# Patient Record
Sex: Male | Born: 1950 | Race: White | Hispanic: No | Marital: Married | Smoking: Former smoker
Health system: Southern US, Community
[De-identification: ages and names within clinical notes are randomized; demographics above are authoritative.]

## PROBLEM LIST (undated history)

## (undated) DIAGNOSIS — Z87442 Personal history of urinary calculi: Secondary | ICD-10-CM

## (undated) DIAGNOSIS — I10 Essential (primary) hypertension: Secondary | ICD-10-CM

## (undated) DIAGNOSIS — K219 Gastro-esophageal reflux disease without esophagitis: Secondary | ICD-10-CM

## (undated) DIAGNOSIS — C801 Malignant (primary) neoplasm, unspecified: Secondary | ICD-10-CM

## (undated) DIAGNOSIS — R29898 Other symptoms and signs involving the musculoskeletal system: Secondary | ICD-10-CM

## (undated) HISTORY — PX: NECK SURGERY: SHX720

## (undated) HISTORY — PX: COLONOSCOPY: SHX174

## (undated) HISTORY — PX: POLYPECTOMY: SHX149

## (undated) HISTORY — PX: TONSILLECTOMY: SUR1361

## (undated) HISTORY — PX: STOMACH SURGERY: SHX791

## (undated) HISTORY — PX: CATARACT EXTRACTION W/ INTRAOCULAR LENS  IMPLANT, BILATERAL: SHX1307

## (undated) HISTORY — PX: BACK SURGERY: SHX140

## (undated) HISTORY — PX: CHOLECYSTECTOMY: SHX55

---

## 1982-12-11 HISTORY — PX: STOMACH SURGERY: SHX791

## 2000-09-04 ENCOUNTER — Emergency Department (HOSPITAL_COMMUNITY): Admission: EM | Admit: 2000-09-04 | Discharge: 2000-09-04 | Payer: Self-pay | Admitting: Emergency Medicine

## 2000-09-05 ENCOUNTER — Encounter: Payer: Self-pay | Admitting: Family Medicine

## 2000-09-05 ENCOUNTER — Encounter: Admission: RE | Admit: 2000-09-05 | Discharge: 2000-09-05 | Payer: Self-pay | Admitting: Family Medicine

## 2000-09-05 ENCOUNTER — Inpatient Hospital Stay (HOSPITAL_COMMUNITY): Admission: EM | Admit: 2000-09-05 | Discharge: 2000-09-09 | Payer: Self-pay | Admitting: Emergency Medicine

## 2004-01-27 ENCOUNTER — Observation Stay (HOSPITAL_COMMUNITY): Admission: RE | Admit: 2004-01-27 | Discharge: 2004-01-28 | Payer: Self-pay | Admitting: General Surgery

## 2011-11-28 ENCOUNTER — Other Ambulatory Visit: Payer: Self-pay | Admitting: Gastroenterology

## 2011-12-19 ENCOUNTER — Other Ambulatory Visit: Payer: Self-pay | Admitting: Gastroenterology

## 2011-12-19 DIAGNOSIS — Z86010 Personal history of colonic polyps: Secondary | ICD-10-CM

## 2012-01-08 ENCOUNTER — Ambulatory Visit
Admission: RE | Admit: 2012-01-08 | Discharge: 2012-01-08 | Disposition: A | Discharge status: Home / Self Care | Payer: BC Managed Care – PPO | Source: Ambulatory Visit | Attending: Gastroenterology | Admitting: Gastroenterology

## 2012-01-08 DIAGNOSIS — Z86010 Personal history of colonic polyps: Secondary | ICD-10-CM

## 2015-12-12 DIAGNOSIS — K274 Chronic or unspecified peptic ulcer, site unspecified, with hemorrhage: Secondary | ICD-10-CM

## 2015-12-12 DIAGNOSIS — K284 Chronic or unspecified gastrojejunal ulcer with hemorrhage: Secondary | ICD-10-CM

## 2015-12-12 HISTORY — DX: Chronic or unspecified peptic ulcer, site unspecified, with hemorrhage: K27.4

## 2015-12-12 HISTORY — DX: Chronic or unspecified gastrojejunal ulcer with hemorrhage: K28.4

## 2016-06-22 DIAGNOSIS — I10 Essential (primary) hypertension: Secondary | ICD-10-CM | POA: Insufficient documentation

## 2017-08-09 ENCOUNTER — Ambulatory Visit: Payer: Medicare HMO | Attending: Family Medicine | Admitting: Physical Therapy

## 2017-08-09 DIAGNOSIS — R262 Difficulty in walking, not elsewhere classified: Secondary | ICD-10-CM

## 2017-08-09 DIAGNOSIS — G8929 Other chronic pain: Secondary | ICD-10-CM | POA: Diagnosis present

## 2017-08-09 DIAGNOSIS — M5441 Lumbago with sciatica, right side: Secondary | ICD-10-CM | POA: Diagnosis not present

## 2017-08-09 DIAGNOSIS — M6281 Muscle weakness (generalized): Secondary | ICD-10-CM | POA: Diagnosis present

## 2017-08-09 NOTE — Therapy (Signed)
[facility] [place], [place], [address] Phone: [phone]   Fax:  [phone]  Physical Therapy Evaluation  Patient Details  Name: Miguel Bennett MRN: [id number] Date of Birth: 10-04-1951 Referring Provider: Kathryne Eriksson, MD  Encounter Date: 08/09/2017      PT End of Session - 08/09/17 1417    Visit Number 1   Number of Visits 16   Date for PT Re-Evaluation 10/09/17   PT Start Time 1030   PT Stop Time 1120   PT Time Calculation (min) 50 min   Activity Tolerance Patient tolerated treatment well   Behavior During Therapy [facility] for tasks assessed/performed      No past medical history on file.  No past surgical history on file.  There were no vitals filed for this visit.       Subjective Assessment - 08/09/17 1049    Subjective Pt arriving to therapy today reporting sudden back pain about 3 years ago when he was cutting wood. Pt reports sudden increase in pain which took almost a year to subside. Pt stil reporting intermittent pain that has gradually worsened over the last serveral months. Pt reporting walking makes his pain worse.    How long can you sit comfortably? unlimited   How long can you stand comfortably? 15-20 minutes   How long can you walk comfortably? 30 minutes   Currently in Pain? Yes   Pain Score 3    Pain Location Back   Pain Orientation Right   Pain Descriptors / Indicators Aching   Pain Radiating Towards down Right leg into foot   Pain Onset More than a month ago   Pain Frequency Intermittent   Aggravating Factors  prolonged walking, pt reports some nights he has difficulty sleeping   Pain Relieving Factors sitting            [facility] PT Assessment - 08/09/17 0001      Assessment   Medical Diagnosis right sided low back pain   Referring Provider Kathryne Eriksson, MD   Onset Date/Surgical Date --  3 years ago, worsened over last several months   Hand Dominance Right   Next MD Visit 6 weeks      Precautions   Precautions None     Balance Screen   Has the patient fallen in the past 6 months No     [place] residence   Living Arrangements Spouse/significant other   Home Access Stairs to enter   Entrance Stairs-Number of Steps 5   Entrance Stairs-Rails Can reach both   Home Layout Two level   Alternate Level Stairs-Number of Steps 15   Alternate Level Stairs-Rails Left     Prior Function   Level of Independence Independent   Leisure like to travel, ride your bike and boat, play with dog     Cognition   Overall Cognitive Status Within Functional Limits for tasks assessed     Observation/Other Assessments   Focus on Therapeutic Outcomes (FOTO)  46% limitation     Posture/Postural Control   Posture/Postural Control Postural limitations   Postural Limitations Rounded Shoulders     ROM / Strength   AROM / PROM / Strength AROM;Strength     AROM   AROM Assessment Site Lumbar   Lumbar Flexion 35   Lumbar Extension 10   Lumbar - Right Side Bend 20   Lumbar - Left Side Bend 20   Lumbar - Right Rotation [facility]  Lumbar - Left Rotation [facility]     Strength   Overall Strength Comments Pt grossly 4-/5 strength in R LE compared to 5/5 in left LE     Special Tests    Special Tests Lumbar   Lumbar Tests Straight Leg Raise     Straight Leg Raise   Findings Positive   Side  Right     Transfers   Five time sit to stand comments  25 seconds with UE support     Ambulation/Gait   Gait Comments Pt amb with antalgic gait pattern with decreased weight bearing on the right LE.             Objective measurements completed on examination: See above findings.                  PT Education - 08/09/17 1417    Education provided Yes   Education Details HEP   Person(s) Educated Patient   Methods Explanation;Demonstration;Tactile cues;Handout   Comprehension Verbal cues required;Returned demonstration;Verbalized understanding              PT Long Term Goals - 08/09/17 1427      PT LONG TERM GOAL #1   Title Pt will be independent in his HEP and progression.    Time 8   Period Weeks   Status New   Target Date 10/04/17     PT LONG TERM GOAL #2   Title Pt will improve his FOTO score from 46% limitation to </= 35% limitation.    Time 8   Period Weeks   Status New   Target Date 10/04/17     PT LONG TERM GOAL #3   Title Pt will improve his R LE strength to 5/5 in order to improve gait and functional moiblity.    Time 8   Period Weeks   Status New   Target Date 10/04/17     PT LONG TERM GOAL #4   Title Pt will report pain of less than 3/10 while walking his dog for 3 days consectutive days.    Time 8   Period Weeks   Status New   Target Date 10/04/17     PT LONG TERM GOAL #5   Title pt will be able to amb for greater than 20 minutes with no pain reported.    Time 8   Period Weeks   Status New   Target Date 10/04/17                Plan - 08/09/17 1418    Clinical Impression Statement Patient arriving to therapy as a low complexity evaluation with dx ofsevere DDD of L5-S1 and mild DDD throughout the remainder of the lumbar spine per X-ray impressions provided by Dr. Kathryne Eriksson. Pt reporting pain varies depending on the day and he can't pin point an actual trigger. Pt with limited hamstring flexibility and weakness on R LE compared to the left. Pt with mild tenderness noted in lumbar paraspinals. Pt did however report intermittent radiation down R LE into his toes at times. Pt was issued a HEP. Skilled PT needed to address pt's impairments with the below interventions.    History and Personal Factors relevant to plan of care: Severe DDD in L5-S1, mild DDD in remaining lumbar spine   Clinical Presentation Evolving   Clinical Presentation due to: radiation down right LE into toes   Clinical Decision Making Low   Rehab Potential Good   PT Frequency 2x /  week   PT Duration 8 weeks   PT  Treatment/Interventions ADLs/Self Care Home Management;Cryotherapy;Electrical Stimulation;Moist Heat;Traction;Ultrasound;Neuromuscular re-education;Balance training;Therapeutic exercise;Therapeutic activities;Functional mobility training;Stair training;Gait training;Patient/family education;Manual techniques;Passive range of motion;Taping;Dry needling   PT Next Visit Plan Mechanical Traction, Lumbar stretching and core strengthening and stability   PT Home Exercise Plan bridges, hamstring stretches, SKTC, Piriformis stretch, supine marching   Consulted and Agree with Plan of Care Patient      Patient will benefit from skilled therapeutic intervention in order to improve the following deficits and impairments:  Pain, Impaired flexibility, Postural dysfunction, Decreased range of motion, Difficulty walking, Decreased activity tolerance, Decreased strength, Abnormal gait  Visit Diagnosis: Chronic right-sided low back pain with right-sided sciatica  Muscle weakness (generalized)  Difficulty in walking, not elsewhere classified     Problem List There are no active problems to display for this patient.   Oretha Caprice, MPT 08/09/2017, 2:35 PM  [facility] [place], [place], [address] Phone: [phone]   Fax:  [phone]  Name: Miguel Bennett MRN: [id number] Date of Birth: 1951/11/19

## 2017-08-09 NOTE — Patient Instructions (Addendum)
  Supine Marching:  Tighten up your abdominals, keeping back flat on the bed/mat table Alternate lifting knee up, like you are marching 20-30 times Twice daily     Hamstring Stretch: Hold 30 seconds Repeat 3 times on each leg Twice a day   Single Knee to Chest Bring right knee toward your chest Hold 20 seconds Repeat 3 times  Twice a day   Piriformis stretch:  Crossing Right leg over left, lift left Leg Hold 20-30 seconds Repeat 3 times Twice a day

## 2017-08-14 ENCOUNTER — Ambulatory Visit: Payer: Medicare HMO | Attending: Family Medicine | Admitting: Physical Therapy

## 2017-08-14 DIAGNOSIS — G8929 Other chronic pain: Secondary | ICD-10-CM

## 2017-08-14 DIAGNOSIS — R262 Difficulty in walking, not elsewhere classified: Secondary | ICD-10-CM

## 2017-08-14 DIAGNOSIS — M6281 Muscle weakness (generalized): Secondary | ICD-10-CM

## 2017-08-14 DIAGNOSIS — M5441 Lumbago with sciatica, right side: Secondary | ICD-10-CM | POA: Diagnosis not present

## 2017-08-14 NOTE — Patient Instructions (Signed)

## 2017-08-14 NOTE — Therapy (Signed)
[facility] [place], [place], [address] Phone: [phone]   Fax:  [phone]  Physical Therapy Treatment  Patient Details  Name: Miguel Bennett MRN: [id number] Date of Birth: [birth year] Referring Provider: Kathryne Eriksson, MD  Encounter Date: 08/14/2017      PT End of Session - 08/14/17 1352    Visit Number 2   Number of Visits 16   Date for PT Re-Evaluation 10/09/17   PT Start Time 1962   PT Stop Time 1443   PT Time Calculation (min) 54 min   Activity Tolerance Patient tolerated treatment well   Behavior During Therapy [facility] for tasks assessed/performed      No past medical history on file.  No past surgical history on file.  There were no vitals filed for this visit.      Subjective Assessment - 08/14/17 1350    Subjective Reports having a good day with pain today. Reports that he has had several good days lately with no pain radiation down to his R foot.   How long can you sit comfortably? unlimited   How long can you stand comfortably? 15-20 minutes   How long can you walk comfortably? 30 minutes   Currently in Pain? Yes   Pain Score 2    Pain Location Back   Pain Orientation Right   Pain Descriptors / Indicators Burning   Pain Onset More than a month ago   Aggravating Factors  Prolonged walking   Pain Relieving Factors Sitting, resting            [facility] PT Assessment - 08/14/17 0001      Assessment   Medical Diagnosis right sided low back pain   Hand Dominance Right   Next MD Visit 6 weeks     Precautions   Precautions None                     [facility] Adult PT Treatment/Exercise - 08/14/17 0001      Exercises   Exercises Lumbar     Lumbar Exercises: Stretches   Active Hamstring Stretch 3 reps;30 seconds   Single Knee to Chest Stretch 1 rep;30 seconds  RLE     Lumbar Exercises: Aerobic   Stationary Bike Nustep L4 x10 min with core activation     Lumbar Exercises: Supine   Bridge 10 reps;5 seconds   Straight Leg Raise 10 reps;5 seconds  BLE     Modalities   Modalities Traction     Traction   Type of Traction Lumbar   Min (lbs) 5   Max (lbs) 60   Hold Time 99   Rest Time 5   Time 15                PT Education - 08/14/17 1427    Education provided Yes   Education Details HEP- posture/ADLs handout   Person(s) Educated Patient   Methods Explanation;Handout   Comprehension Verbalized understanding             PT Long Term Goals - 08/14/17 1406      PT LONG TERM GOAL #1   Title Pt will be independent in his HEP and progression.    Time 8   Period Weeks   Status Achieved     PT LONG TERM GOAL #2   Title Pt will improve his FOTO score from 46% limitation to </= 35% limitation.    Time 8   Period Weeks  Status New     PT LONG TERM GOAL #3   Title Pt will improve his R LE strength to 5/5 in order to improve gait and functional moiblity.    Time 8   Period Weeks   Status New     PT LONG TERM GOAL #4   Title Pt will report pain of less than 3/10 while walking his dog for 3 days consectutive days.    Time 8   Period Weeks   Status New     PT LONG TERM GOAL #5   Title pt will be able to amb for greater than 20 minutes with no pain reported.    Time 8   Period Weeks   Status New               Plan - 08/14/17 1428    Clinical Impression Statement Patient arrived to clinic with reports of low lumbar pain and having good days recently. Patient guided through core activation education and implementation with core/lumbar strengthening exercises. Patient's LE and core deficits notable with SLR secondary to instability of LE with exercises. No complaints of any increased pain with exercises today. Patient educated regarding mechanical lumbar traction and educated to report to PT staff so that traction can be adjusted with symptoms. Patient provided HEP for posture and ADLs handout with patient verbalizing understanding.    Rehab Potential Good   PT Frequency 2x / week   PT Duration 8 weeks   PT Treatment/Interventions ADLs/Self Care Home Management;Cryotherapy;Electrical Stimulation;Moist Heat;Traction;Ultrasound;Neuromuscular re-education;Balance training;Therapeutic exercise;Therapeutic activities;Functional mobility training;Stair training;Gait training;Patient/family education;Manual techniques;Passive range of motion;Taping;Dry needling   PT Next Visit Plan Monitor traction response with stretching and core strengthening per MPT POC.   PT Home Exercise Plan bridges, hamstring stretches, SKTC, Piriformis stretch, supine marching   Consulted and Agree with Plan of Care Patient      Patient will benefit from skilled therapeutic intervention in order to improve the following deficits and impairments:  Pain, Impaired flexibility, Postural dysfunction, Decreased range of motion, Difficulty walking, Decreased activity tolerance, Decreased strength, Abnormal gait  Visit Diagnosis: Chronic right-sided low back pain with right-sided sciatica  Muscle weakness (generalized)  Difficulty in walking, not elsewhere classified     Problem List There are no active problems to display for this patient.   Wynelle Fanny, PTA 08/14/2017, 2:47 PM  [facility] [address] [place], [place], [address] Phone: [phone]   Fax:  [phone]  Name: Miguel Bennett MRN: [id number] Date of Birth: [birth year]

## 2017-08-20 ENCOUNTER — Ambulatory Visit: Payer: Medicare HMO | Admitting: Physical Therapy

## 2017-08-20 DIAGNOSIS — G8929 Other chronic pain: Secondary | ICD-10-CM

## 2017-08-20 DIAGNOSIS — M5441 Lumbago with sciatica, right side: Principal | ICD-10-CM

## 2017-08-20 DIAGNOSIS — M6281 Muscle weakness (generalized): Secondary | ICD-10-CM

## 2017-08-20 DIAGNOSIS — R262 Difficulty in walking, not elsewhere classified: Secondary | ICD-10-CM

## 2017-08-20 NOTE — Therapy (Signed)
[facility] [place], [place], [address] Phone: [phone]   Fax:  [phone]  Physical Therapy Treatment  Patient Details  Name: Miguel Bennett MRN: [id number] Date of Birth: February 05, 1951 Referring Provider: Kathryne Eriksson, MD  Encounter Date: 08/20/2017      PT End of Session - 08/20/17 1303    Visit Number 3   Number of Visits 16   Date for PT Re-Evaluation 10/09/17   PT Start Time 1302   PT Stop Time 1348   PT Time Calculation (min) 46 min   Activity Tolerance Patient tolerated treatment well   Behavior During Therapy [facility] for tasks assessed/performed      No past medical history on file.  No past surgical history on file.  There were no vitals filed for this visit.      Subjective Assessment - 08/20/17 1302    Subjective Reports that he has still been having good days with his pain although he has had a few days of 4/10 since last treatment. Reports that he doesn't think 60# is enough for traction.   How long can you sit comfortably? unlimited   How long can you stand comfortably? 15-20 minutes   How long can you walk comfortably? 30 minutes   Currently in Pain? Yes   Pain Score 2    Pain Location Back   Pain Orientation Right   Pain Descriptors / Indicators Burning   Pain Onset More than a month ago            [facility] PT Assessment - 08/20/17 0001      Assessment   Medical Diagnosis right sided low back pain   Hand Dominance Right   Next MD Visit 6 weeks     Precautions   Precautions None                     [facility] Adult PT Treatment/Exercise - 08/20/17 0001      Lumbar Exercises: Aerobic   Stationary Bike Nustep L4 x10 min with core activation     Lumbar Exercises: Standing   Shoulder Extension Strengthening;Both;20 reps   Shoulder Extension Limitations Pink XTS with core breathing VCs   Other Standing Lumbar Exercises Lat pulldown pink XTS x20 reps with core breathing VCs     Lumbar Exercises: Supine   Bridge 20 reps;5 seconds   Straight Leg Raise 15 reps;5 seconds   Straight Leg Raises Limitations BLE     Modalities   Modalities Traction     Traction   Type of Traction Lumbar   Min (lbs) 5   Max (lbs) 65   Hold Time 99   Rest Time 5   Time 15                     PT Long Term Goals - 08/14/17 1406      PT LONG TERM GOAL #1   Title Pt will be independent in his HEP and progression.    Time 8   Period Weeks   Status Achieved     PT LONG TERM GOAL #2   Title Pt will improve his FOTO score from 46% limitation to </= 35% limitation.    Time 8   Period Weeks   Status New     PT LONG TERM GOAL #3   Title Pt will improve his R LE strength to 5/5 in order to improve gait and functional moiblity.    Time  8   Period Weeks   Status New     PT LONG TERM GOAL #4   Title Pt will report pain of less than 3/10 while walking his dog for 3 days consectutive days.    Time 8   Period Weeks   Status New     PT LONG TERM GOAL #5   Title pt will be able to amb for greater than 20 minutes with no pain reported.    Time 8   Period Weeks   Status New               Plan - 08/20/17 1341    Clinical Impression Statement Patient tolerated today's treatment well as he was progressed to more advanced core and lumbar strengthening. Patient educated in core breathing with exercises to further improve core strength. Core and LE weakness noted with B SLR today via the instability and fatigue of the muscles. Traction increased to 65# max today per patient reporting no difference noted following previous traction session. No negetive complaints following end of 65# traction session.   Rehab Potential Good   PT Frequency 2x / week   PT Duration 8 weeks   PT Treatment/Interventions ADLs/Self Care Home Management;Cryotherapy;Electrical Stimulation;Moist Heat;Traction;Ultrasound;Neuromuscular re-education;Balance training;Therapeutic exercise;Therapeutic  activities;Functional mobility training;Stair training;Gait training;Patient/family education;Manual techniques;Passive range of motion;Taping;Dry needling   PT Next Visit Plan Monitor traction response with stretching and core strengthening per MPT POC.   PT Home Exercise Plan bridges, hamstring stretches, SKTC, Piriformis stretch, supine marching   Consulted and Agree with Plan of Care Patient      Patient will benefit from skilled therapeutic intervention in order to improve the following deficits and impairments:  Pain, Impaired flexibility, Postural dysfunction, Decreased range of motion, Difficulty walking, Decreased activity tolerance, Decreased strength, Abnormal gait  Visit Diagnosis: Chronic right-sided low back pain with right-sided sciatica  Muscle weakness (generalized)  Difficulty in walking, not elsewhere classified     Problem List There are no active problems to display for this patient.   Wynelle Fanny, PTA 08/20/2017, 1:57 PM  [facility] [address] [place], [place], [address] Phone: [phone]   Fax:  [phone]  Name: Miguel Bennett MRN: [id number] Date of Birth: [birth year]

## 2017-08-23 ENCOUNTER — Ambulatory Visit: Payer: Medicare HMO | Admitting: Physical Therapy

## 2017-08-23 DIAGNOSIS — G8929 Other chronic pain: Secondary | ICD-10-CM

## 2017-08-23 DIAGNOSIS — M5441 Lumbago with sciatica, right side: Secondary | ICD-10-CM | POA: Diagnosis not present

## 2017-08-23 DIAGNOSIS — R262 Difficulty in walking, not elsewhere classified: Secondary | ICD-10-CM

## 2017-08-23 DIAGNOSIS — M6281 Muscle weakness (generalized): Secondary | ICD-10-CM

## 2017-08-23 NOTE — Therapy (Signed)
[facility] [place], [place], [address] Phone: [phone]   Fax:  [phone]  Physical Therapy Treatment  Patient Details  Name: DIAZ CRAGO MRN: [id number] Date of Birth: [birth year] Referring Provider: Kathryne Eriksson, MD  Encounter Date: 08/23/2017      PT End of Session - 08/23/17 1303    Visit Number 4   Number of Visits 16   Date for PT Re-Evaluation 10/09/17   Authorization Type FOTO at 10th visit, G-code every 10th visit, KX modifier after visit 15   PT Start Time 1304   PT Stop Time 1357   PT Time Calculation (min) 53 min   Activity Tolerance Patient tolerated treatment well   Behavior During Therapy [facility] for tasks assessed/performed      No past medical history on file.  No past surgical history on file.  There were no vitals filed for this visit.      Subjective Assessment - 08/23/17 1302    Subjective Reports that he did have a bad day Tuesday that last for a few hours and he laid down with 4/10 intensity.   How long can you sit comfortably? unlimited   How long can you stand comfortably? 15-20 minutes   How long can you walk comfortably? 30 minutes   Currently in Pain? No/denies            [facility] PT Assessment - 08/23/17 0001      Assessment   Medical Diagnosis right sided low back pain   Hand Dominance Right   Next MD Visit 6 weeks     Precautions   Precautions None                     [facility] Adult PT Treatment/Exercise - 08/23/17 0001      Lumbar Exercises: Aerobic   Stationary Bike Nustep L5 x10 min with core activation     Lumbar Exercises: Supine   Clam 15 reps   Clam Limitations BLE   Bridge 20 reps;5 seconds   Bridge Limitations with green theraband for hip abductor strengthening   Straight Leg Raise 15 reps;5 seconds   Straight Leg Raises Limitations BLE   Other Supine Lumbar Exercises Dead bug x15 reps     Modalities   Modalities Traction     Traction   Type of Traction Lumbar   Min (lbs) 5   Max (lbs) 65   Hold Time 99   Rest Time 5   Time 15                     PT Long Term Goals - 08/14/17 1406      PT LONG TERM GOAL #1   Title Pt will be independent in his HEP and progression.    Time 8   Period Weeks   Status Achieved     PT LONG TERM GOAL #2   Title Pt will improve his FOTO score from 46% limitation to </= 35% limitation.    Time 8   Period Weeks   Status New     PT LONG TERM GOAL #3   Title Pt will improve his R LE strength to 5/5 in order to improve gait and functional moiblity.    Time 8   Period Weeks   Status New     PT LONG TERM GOAL #4   Title Pt will report pain of less than 3/10 while walking his dog for 3 days  consectutive days.    Time 8   Period Weeks   Status New     PT LONG TERM GOAL #5   Title pt will be able to amb for greater than 20 minutes with no pain reported.    Time 8   Period Weeks   Status New               Plan - 08/23/17 1355    Clinical Impression Statement Patient tolerated today's treatment well with only instance of discomfort beginning was with bridging with green theraband for hip abductor strengthening. Patient able to complete exercises as directed with VCs for core activation. Traction maintained at 65# today per patient request.   Rehab Potential Good   PT Frequency 2x / week   PT Duration 8 weeks   PT Treatment/Interventions ADLs/Self Care Home Management;Cryotherapy;Electrical Stimulation;Moist Heat;Traction;Ultrasound;Neuromuscular re-education;Balance training;Therapeutic exercise;Therapeutic activities;Functional mobility training;Stair training;Gait training;Patient/family education;Manual techniques;Passive range of motion;Taping;Dry needling   PT Next Visit Plan Monitor traction response with stretching and core strengthening per MPT POC.   PT Home Exercise Plan bridges, hamstring stretches, SKTC, Piriformis stretch, supine marching   Consulted  and Agree with Plan of Care Patient      Patient will benefit from skilled therapeutic intervention in order to improve the following deficits and impairments:  Pain, Impaired flexibility, Postural dysfunction, Decreased range of motion, Difficulty walking, Decreased activity tolerance, Decreased strength, Abnormal gait  Visit Diagnosis: Chronic right-sided low back pain with right-sided sciatica  Muscle weakness (generalized)  Difficulty in walking, not elsewhere classified       G-Codes - 09/18/2017 1301    Functional Assessment Tool Used (Outpatient Only) FOTO, clinical assessment   Functional Limitation Mobility: Walking and moving around   Mobility: Walking and Moving Around Current Status ([id number]) At least 40 percent but less than 60 percent impaired, limited or restricted   Mobility: Walking and Moving Around Goal Status [phone]) At least 40 percent but less than 60 percent impaired, limited or restricted      Problem List There are no active problems to display for this patient.   Wynelle Fanny, PTA 08/23/2017, 1:58 PM  [facility] [address] [place], [place], [address] Phone: [phone]   Fax:  [phone]  Name: STANLY SI MRN: [id number] Date of Birth: April 02, 1951

## 2017-08-27 ENCOUNTER — Ambulatory Visit: Payer: Medicare HMO | Admitting: Physical Therapy

## 2017-08-27 DIAGNOSIS — M6281 Muscle weakness (generalized): Secondary | ICD-10-CM

## 2017-08-27 DIAGNOSIS — G8929 Other chronic pain: Secondary | ICD-10-CM

## 2017-08-27 DIAGNOSIS — M5441 Lumbago with sciatica, right side: Principal | ICD-10-CM

## 2017-08-27 NOTE — Therapy (Signed)
[facility] [place], [place], [address] Phone: [phone]   Fax:  [phone]  Physical Therapy Treatment  Patient Details  Name: Miguel Bennett MRN: [id number] Date of Birth: 01-21-1951 Referring Provider: Kathryne Eriksson, MD  Encounter Date: 08/27/2017      PT End of Session - 08/27/17 1355    Visit Number 5   Number of Visits 16   Date for PT Re-Evaluation 10/09/17   Authorization Type FOTO at 10th visit, G-code every 10th visit, KX modifier after visit 15   PT Start Time 1345   PT Stop Time 1442   PT Time Calculation (min) 57 min   Activity Tolerance Patient tolerated treatment well   Behavior During Therapy [facility] for tasks assessed/performed      No past medical history on file.  No past surgical history on file.  There were no vitals filed for this visit.      Subjective Assessment - 08/27/17 1355    Subjective Patient reports that he has no pain today and that there is nothing specific that sets it off.            [facility] PT Assessment - 08/27/17 0001      Posture/Postural Control   Posture Comments R thoracic L lumbar scoliosis, R scapula elevated 1 inch; left ilia depressed 1 inch                     [facility] Adult PT Treatment/Exercise - 08/27/17 0001      Self-Care   Self-Care Other Self-Care Comments   Other Self-Care Comments  Education on dry needling and self MFR with ball     Lumbar Exercises: Stretches   ITB Stretch 1 rep;60 seconds   ITB Stretch Limitations In Left SDLY over pillow for QL stretch     Lumbar Exercises: Aerobic   Stationary Bike L 2 x 10 min     Modalities   Modalities Traction     Traction   Type of Traction Lumbar   Min (lbs) 5   Max (lbs) 70   Hold Time 99   Rest Time 5   Time 15     Manual Therapy   Manual Therapy Myofascial release   Myofascial Release TPR to R QL and glut medius                PT Education - 08/27/17 1436    Education provided Yes   Education Details HEP   Person(s) Educated Patient   Methods Explanation;Demonstration;Handout;Tactile cues;Verbal cues   Comprehension Verbalized understanding;Returned demonstration             PT Long Term Goals - 08/14/17 1406      PT LONG TERM GOAL #1   Title Pt will be independent in his HEP and progression.    Time 8   Period Weeks   Status Achieved     PT LONG TERM GOAL #2   Title Pt will improve his FOTO score from 46% limitation to </= 35% limitation.    Time 8   Period Weeks   Status New     PT LONG TERM GOAL #3   Title Pt will improve his R LE strength to 5/5 in order to improve gait and functional moiblity.    Time 8   Period Weeks   Status New     PT LONG TERM GOAL #4   Title Pt will report pain of less than 3/10 while  walking his dog for 3 days consectutive days.    Time 8   Period Weeks   Status New     PT LONG TERM GOAL #5   Title pt will be able to amb for greater than 20 minutes with no pain reported.    Time 8   Period Weeks   Status New               Plan - 08/27/17 1437    Clinical Impression Statement Patient presents today without pain but reporting pain is inconsistent in nature. He was educated regarding TPDN and may benefit from it to help with potural deficits. He has latent trigger points in R QL and gluteals. Traction was increased to 70# today.   PT Treatment/Interventions ADLs/Self Care Home Management;Cryotherapy;Electrical Stimulation;Moist Heat;Traction;Ultrasound;Neuromuscular re-education;Balance training;Therapeutic exercise;Therapeutic activities;Functional mobility training;Stair training;Gait training;Patient/family education;Manual techniques;Passive range of motion;Taping;Dry needling   PT Next Visit Plan Monitor traction response with stretching and core strengthening per MPT POC. DN to R QL and gluteals.   PT Home Exercise Plan bridges, hamstring stretches, SKTC, Piriformis stretch, supine  marching R QL stretch      Patient will benefit from skilled therapeutic intervention in order to improve the following deficits and impairments:  Pain, Impaired flexibility, Postural dysfunction, Decreased range of motion, Difficulty walking, Decreased activity tolerance, Decreased strength, Abnormal gait  Visit Diagnosis: Chronic right-sided low back pain with right-sided sciatica  Muscle weakness (generalized)     Problem List There are no active problems to display for this patient.   Almyra Free Chavie Kolinski PT 08/27/2017, 2:46 PM  [facility] [place], [place], [address] Phone: [phone]   Fax:  [phone]  Name: Miguel Bennett MRN: [id number] Date of Birth: 1951/01/27

## 2017-08-27 NOTE — Patient Instructions (Signed)
[facility]  DRY NEEDLING CONSENT FORM   Trigger point dry needling is a physical therapy approach to treat Myofascial Pain and Dysfunction.  Dry Needling (DN) is a valuable and effective way to deactivate myofascial trigger points (muscle knots). It is skilled intervention that uses a thin filiform needle to penetrate the skin and stimulate underlying myofascial trigger points, muscular, and connective tissues for the management of neuromusculoskeletal pain and movement impairments.  A local twitch response (LTR) will be elicited.  This can sometimes feel like a deep ache in the muscle during the procedure. Multiple trigger points in multiple muscles can be treated during each treatment.  No medication of any kind is injected.   As with any medical treatment and procedure, there are possible adverse events.  While significant adverse events are uncommon, they do sometimes occur and must be considered prior to giving consent.  1. Dry needling often causes a "post needling soreness".  There can be an increase in pain from a couple of hours to 2-3 days, followed by an improvement in the overall pain state. 2. Any time a needle is used there is a risk of infection.  However, we are using new, sterile, and disposable needles; infections are extremely rare. 3. There is a possibility that you may bleed or bruise.  You may feel tired and some nausea following treatment. 4. There is a rare possibility of a pneumothorax (air in the chest cavity). 5. Allergic reaction to nickel in the stainless steel needle. 6. If a nerve is touched, it may cause paresthesia (a prickling/shock sensation) which is usually brief, but may continue for a couple of days.  Following treatment stay hydrated.  Continue regular activities but not too vigorous initially after treatment for 24-48 hours.  Dry Needling is best when combined with other physical therapy interventions such as strengthening,  stretching and other therapeutic modalities.  You may use heat after needling to help with post needling soreness.   PLEASE ANSWER THE FOLLOWING QUESTIONS:  Do you have a lack of sensation?   Y/N  Do you have a phobia or fear of needles  Y/N  Are you pregnant?    Y/N If yes:  How many weeks? _____  Do you have any implanted devices?  Y/N If yes:  Pacemaker/Spinal Cord         Stimulator/Deep Brain         Stimulator/Insulin          Pump/Other: ____________ Do you have any implants?   Y/N If yes:      Do you take any blood thinners?   Y/N If yes: Coumadin          (Warfarin)/Other:  Do you have a bleeding disorder?   Y/N If yes: What kind:   Do you take any immunosuppressants?  Y/N If yes:   What kind:   Do you take anti-inflammatories?   Y/N If yes: What kind:  Have you ever been diagnosed with Scoliosis? Y/N  Have you had back surgery?    Y/N If yes:         Laminectomy/Fusion/Other:   I have read, or had read to me, the above.  I have had the opportunity to ask any questions.  All of my questions have been answered to my satisfaction and I understand the risks involved with dry needling.  I consent to examination and treatment at [facility], including dry needling, of any and all of  my involved and affected muscles.   Iliotibial Band Stretch, Side-Lying   Lie on side, back to edge of bed, pillow under left side, top arm in front. Allow top leg to drape behind over edge. Hold 30 or more seconds.  Repeat 3 times per session. Do _2-3 sessions per day.      Madelyn Flavors, PT 08/27/17 2:34 PM [facility] [address] [place], [place], [address] Phone: [phone]   Fax:  [phone]

## 2017-08-30 ENCOUNTER — Ambulatory Visit: Payer: Medicare HMO | Attending: Family Medicine | Admitting: Physical Therapy

## 2017-08-30 DIAGNOSIS — M6281 Muscle weakness (generalized): Secondary | ICD-10-CM

## 2017-08-30 DIAGNOSIS — M5441 Lumbago with sciatica, right side: Secondary | ICD-10-CM | POA: Insufficient documentation

## 2017-08-30 DIAGNOSIS — G8929 Other chronic pain: Secondary | ICD-10-CM

## 2017-08-30 DIAGNOSIS — R262 Difficulty in walking, not elsewhere classified: Secondary | ICD-10-CM | POA: Diagnosis present

## 2017-08-30 NOTE — Therapy (Signed)
[facility] [place], [place], [address] Phone: [phone]   Fax:  [phone]  Physical Therapy Treatment  Patient Details  Name: Miguel Bennett MRN: [id number] Date of Birth: May 19, 1951 Referring Provider: Kathryne Eriksson, MD  Encounter Date: 08/30/2017      PT End of Session - 08/30/17 1436    Visit Number 6   Number of Visits 16   Date for PT Re-Evaluation 10/09/17   Authorization Type FOTO at 10th visit, G-code every 10th visit, KX modifier after visit 15   PT Start Time 0145   PT Stop Time 0239   PT Time Calculation (min) 54 min      No past medical history on file.  No past surgical history on file.  There were no vitals filed for this visit.      Subjective Assessment - 08/30/17 1430    Subjective Real sore after last treatment.  Wasn't able to do my HEP.   Pain Score 3    Pain Location Back   Pain Orientation Right   Pain Descriptors / Indicators Burning   Pain Onset More than a month ago                         [facility] Adult PT Treatment/Exercise - 08/30/17 0001      Exercises   Exercises Knee/Hip     Lumbar Exercises: Aerobic   Stationary Bike Level 5 x 15 minutes.     Modalities   Modalities Social worker Location Affected low back region.   Electrical Stimulation Action IFC   Electrical Stimulation Parameters 80-150 Hz on 100% scan x 20 minutes.   Electrical Stimulation Goals Pain     Manual Therapy   Manual Therapy Scapular mobilization   Scapular Mobilization STW/M right of L4 to S1 region x 8 minutes.                     PT Long Term Goals - 08/14/17 1406      PT LONG TERM GOAL #1   Title Pt will be independent in his HEP and progression.    Time 8   Period Weeks   Status Achieved     PT LONG TERM GOAL #2   Title Pt will improve his FOTO score from 46% limitation to </= 35% limitation.    Time 8   Period Weeks   Status New     PT LONG TERM GOAL #3   Title Pt will improve his R LE strength to 5/5 in order to improve gait and functional moiblity.    Time 8   Period Weeks   Status New     PT LONG TERM GOAL #4   Title Pt will report pain of less than 3/10 while walking his dog for 3 days consectutive days.    Time 8   Period Weeks   Status New     PT LONG TERM GOAL #5   Title pt will be able to amb for greater than 20 minutes with no pain reported.    Time 8   Period Weeks   Status New               Plan - 08/30/17 1435    Clinical Impression Statement Good response to treatment today.  Patient is considering TPDN next visit.      Patient will benefit  from skilled therapeutic intervention in order to improve the following deficits and impairments:     Visit Diagnosis: Chronic right-sided low back pain with right-sided sciatica  Muscle weakness (generalized)  Difficulty in walking, not elsewhere classified     Problem List There are no active problems to display for this patient.   Scot Shiraishi, [place] MPT 08/30/2017, 2:42 PM  [facility] [address] [place], [place], [address] Phone: [phone]   Fax:  [phone]  Name: Miguel Bennett MRN: [id number] Date of Birth: [birth year]

## 2017-09-03 ENCOUNTER — Ambulatory Visit: Payer: Medicare HMO | Admitting: Physical Therapy

## 2017-09-03 DIAGNOSIS — G8929 Other chronic pain: Secondary | ICD-10-CM

## 2017-09-03 DIAGNOSIS — M5441 Lumbago with sciatica, right side: Principal | ICD-10-CM

## 2017-09-03 DIAGNOSIS — M6281 Muscle weakness (generalized): Secondary | ICD-10-CM

## 2017-09-03 DIAGNOSIS — R262 Difficulty in walking, not elsewhere classified: Secondary | ICD-10-CM

## 2017-09-03 NOTE — Therapy (Addendum)
[facility] [place], [place], [address] Phone: [phone]   Fax:  [phone]  Physical Therapy Treatment  Patient Details  Name: Miguel Bennett MRN: [id number] Date of Birth: 25-Jul-1951 Referring Provider: Kathryne Eriksson, MD  Encounter Date: 09/03/2017      PT End of Session - 09/03/17 1301    Visit Number 7   Number of Visits 16   Date for PT Re-Evaluation 10/09/17   Authorization Type FOTO at 10th visit, G-code every 10th visit, KX modifier after visit 15   PT Start Time 1300   PT Stop Time 1350   PT Time Calculation (min) 50 min   Activity Tolerance Patient tolerated treatment well   Behavior During Therapy [facility] for tasks assessed/performed      No past medical history on file.  No past surgical history on file.  There were no vitals filed for this visit.      Subjective Assessment - 09/03/17 1300    Subjective Reports that he did have a flare up since last week but does not know what caused flare up. Arrived with his back feeling okay. Continues to have pain and having to sit with cooking or washing dishes.   How long can you sit comfortably? unlimited   How long can you stand comfortably? 15-20 minutes   How long can you walk comfortably? 30 minutes   Currently in Pain? No/denies            [facility] PT Assessment - 09/03/17 0001      Assessment   Medical Diagnosis right sided low back pain   Hand Dominance Right   Next MD Visit 6 weeks     Precautions   Precautions None                     [facility] Adult PT Treatment/Exercise - 09/03/17 0001      Lumbar Exercises: Aerobic   Stationary Bike L6 x10 min     Lumbar Exercises: Standing   Row Strengthening;Both;20 reps   Row Limitations Pink XTS   Other Standing Lumbar Exercises Lat pulldown pink XTS x20 reps with core breathing VCs     Lumbar Exercises: Supine   Bridge 20 reps;3 seconds   Bridge Limitations with red theraband for hip  abductor strengthening     Lumbar Exercises: Sidelying   Hip Abduction 15 reps     Lumbar Exercises: Prone   Single Arm Raise Right;Left;20 reps   Straight Leg Raise 20 reps   Opposite Arm/Leg Raise Right arm/Left leg;Left arm/Right leg;15 reps     Modalities   Modalities Traction     Traction   Type of Traction Lumbar   Min (lbs) 5   Max (lbs) 70   Hold Time 99   Rest Time 5   Time 15                     PT Long Term Goals - 08/14/17 1406      PT LONG TERM GOAL #1   Title Pt will be independent in his HEP and progression.    Time 8   Period Weeks   Status Achieved     PT LONG TERM GOAL #2   Title Pt will improve his FOTO score from 46% limitation to </= 35% limitation.    Time 8   Period Weeks   Status New     PT LONG TERM GOAL #3   Title  Pt will improve his R LE strength to 5/5 in order to improve gait and functional moiblity.    Time 8   Period Weeks   Status New     PT LONG TERM GOAL #4   Title Pt will report pain of less than 3/10 while walking his dog for 3 days consectutive days.    Time 8   Period Weeks   Status New     PT LONG TERM GOAL #5   Title pt will be able to amb for greater than 20 minutes with no pain reported.    Time 8   Period Weeks   Status New               Plan - 09/03/17 1342    Clinical Impression Statement Patient arrived to treatment with reports of a flare up since his last treatment but reports that he is unable to pinpoint a cause for the flare up. Patient guided through standing posture exercises as well as prone lumbar stabilization exercises in which patient demonstrated fairly good lumbar stabilization. With LE raises patient experienced R low back discomfort which faded with rest. No symptoms with arm raise in prone. With opp arm/ opp leg raise patient experienced B low back discomfort. Patient requested traction continued at 70# to assess symptoms again. Upon standing from end of traction session  patient reported R low back discomfort. Patient educated by PTA and [place] Applegate, MPT to contact PCP to consult with him the next step as pain and flare ups still presenting even after completing all avenues in PT.   Rehab Potential Good   PT Frequency 2x / week   PT Duration 8 weeks   PT Treatment/Interventions ADLs/Self Care Home Management;Cryotherapy;Electrical Stimulation;Moist Heat;Traction;Ultrasound;Neuromuscular re-education;Balance training;Therapeutic exercise;Therapeutic activities;Functional mobility training;Stair training;Gait training;Patient/family education;Manual techniques;Passive range of motion;Taping;Dry needling   PT Next Visit Plan Continue per MD discretion.   PT Home Exercise Plan bridges, hamstring stretches, SKTC, Piriformis stretch, supine marching R QL stretch   Consulted and Agree with Plan of Care Patient      Patient will benefit from skilled therapeutic intervention in order to improve the following deficits and impairments:  Pain, Impaired flexibility, Postural dysfunction, Decreased range of motion, Difficulty walking, Decreased activity tolerance, Decreased strength, Abnormal gait  Visit Diagnosis: Chronic right-sided low back pain with right-sided sciatica  Muscle weakness (generalized)  Difficulty in walking, not elsewhere classified     Problem List There are no active problems to display for this patient.   Wynelle Fanny, PTA 09/03/2017, 1:56 PM  [facility] [address] [place], [place], [address] Phone: [phone]   Fax:  [phone]  Name: BLAS RICHES MRN: [id number] Date of Birth: 1951-02-09  PHYSICAL THERAPY DISCHARGE SUMMARY  Visits from Start of Care: 7.  Current functional level related to goals / functional outcomes: See above.   Remaining deficits: See below.   Education / Equipment: HEP. Plan: Patient agrees to discharge.  Patient goals were partially met. Patient  is being discharged due to not returning since the last visit.  ?????          [place]ali Applegate MPT

## 2017-09-05 ENCOUNTER — Other Ambulatory Visit: Payer: Self-pay | Admitting: Family Medicine

## 2017-09-05 DIAGNOSIS — M545 Low back pain: Secondary | ICD-10-CM

## 2017-09-06 ENCOUNTER — Encounter: Payer: Medicare HMO | Admitting: *Deleted

## 2017-09-17 ENCOUNTER — Ambulatory Visit
Admission: RE | Admit: 2017-09-17 | Discharge: 2017-09-17 | Disposition: A | Discharge status: Home / Self Care | Payer: Medicare HMO | Source: Ambulatory Visit | Attending: Family Medicine | Admitting: Family Medicine

## 2017-09-17 DIAGNOSIS — M545 Low back pain: Secondary | ICD-10-CM

## 2019-02-17 ENCOUNTER — Other Ambulatory Visit: Payer: Self-pay | Admitting: Neurological Surgery

## 2019-02-17 DIAGNOSIS — N281 Cyst of kidney, acquired: Secondary | ICD-10-CM

## 2019-03-03 ENCOUNTER — Ambulatory Visit
Admission: RE | Admit: 2019-03-03 | Discharge: 2019-03-03 | Disposition: A | Discharge status: Home / Self Care | Payer: Medicare HMO | Source: Ambulatory Visit | Attending: Neurological Surgery | Admitting: Neurological Surgery

## 2019-03-03 ENCOUNTER — Other Ambulatory Visit: Payer: Self-pay

## 2019-03-03 DIAGNOSIS — N281 Cyst of kidney, acquired: Secondary | ICD-10-CM

## 2019-03-05 ENCOUNTER — Other Ambulatory Visit: Payer: Self-pay | Admitting: Otolaryngology

## 2019-03-05 DIAGNOSIS — R591 Generalized enlarged lymph nodes: Secondary | ICD-10-CM

## 2019-03-11 ENCOUNTER — Other Ambulatory Visit: Payer: Self-pay

## 2019-03-11 ENCOUNTER — Ambulatory Visit
Admission: RE | Admit: 2019-03-11 | Discharge: 2019-03-11 | Disposition: A | Discharge status: Home / Self Care | Payer: Medicare HMO | Source: Ambulatory Visit | Attending: Otolaryngology | Admitting: Otolaryngology

## 2019-03-11 ENCOUNTER — Other Ambulatory Visit: Payer: Medicare HMO

## 2019-03-11 DIAGNOSIS — R591 Generalized enlarged lymph nodes: Secondary | ICD-10-CM

## 2019-03-11 MED ORDER — IOPAMIDOL (ISOVUE-300) INJECTION 61%
75.0000 mL | Freq: Once | INTRAVENOUS | Status: AC | PRN
  Administered 2019-03-11: 75 mL via INTRAVENOUS

## 2019-03-14 NOTE — Pre-Procedure Instructions (Signed)
   Miguel Bennett  03/14/2019     [id number] [place] [place] [address] Phone: [phone] Fax: [phone]   Your procedure is scheduled on Tuesday, March 18, 2019  Report to [facility] Admitting at 5:30 A.M.  Call this number if you have problems the morning of surgery:  [phone]   Remember: Brush your teeth the morning of the procedure with your regular toothpaste.  Do not eat or drink after midnight tonight.  Take these medicines the morning of surgery with A SIP OF WATER :  diltiazem (DILACOR) prednisoLONE acetate (PRED FORTE) eye drops  Stop taking Aspirin (unless otherwise advised by surgeon), vitamins, fish oil and herbal medications. Do not take any NSAIDs ie: Ibuprofen, Advil, Naproxen (Aleve), Motrin, BC and Goody Powder; stop now.    Do not wear jewelry, make-up or nail polish.  Do not wear lotions, powders, or perfumes, or deodorant.  Do not shave 48 hours prior to surgery.  Men may shave face and neck.  Do not bring valuables to the hospital.  [facility] is not responsible for any belongings or valuables.  Contacts, dentures or bridgework may not be worn into surgery.   Patients discharged the day of surgery will not be allowed to drive home.  Special instructions:See " [facility] Preparing For Surgery " sheet. Please read over the following fact sheets that you were given. Pain Booklet, Coughing and Deep Breathing and Surgical Site Infection Prevention

## 2019-03-15 ENCOUNTER — Telehealth: Payer: Self-pay | Admitting: *Deleted

## 2019-03-15 NOTE — Telephone Encounter (Signed)
Oncology Nurse Navigator Documentation  LVMM for ENT Dr. Rosalita Levan MA Vaughan Basta confirming receipt of her 4/1 VMM referring pt to Dr. Isidore Moos.  Gayleen Orem, RN, BSN Head & Neck Oncology Nurse [place] [facility] [place] [phone]

## 2019-03-17 ENCOUNTER — Encounter (HOSPITAL_COMMUNITY): Payer: Self-pay

## 2019-03-17 ENCOUNTER — Other Ambulatory Visit: Payer: Self-pay

## 2019-03-17 ENCOUNTER — Encounter (HOSPITAL_COMMUNITY)
Admission: RE | Admit: 2019-03-17 | Discharge: 2019-03-17 | Disposition: A | Discharge status: Home / Self Care | Payer: Medicare HMO | Source: Ambulatory Visit | Attending: Otolaryngology | Admitting: Otolaryngology

## 2019-03-17 DIAGNOSIS — Z87891 Personal history of nicotine dependence: Secondary | ICD-10-CM | POA: Diagnosis not present

## 2019-03-17 DIAGNOSIS — Z9049 Acquired absence of other specified parts of digestive tract: Secondary | ICD-10-CM | POA: Diagnosis not present

## 2019-03-17 DIAGNOSIS — Z9841 Cataract extraction status, right eye: Secondary | ICD-10-CM | POA: Diagnosis not present

## 2019-03-17 DIAGNOSIS — Z01818 Encounter for other preprocedural examination: Secondary | ICD-10-CM | POA: Insufficient documentation

## 2019-03-17 DIAGNOSIS — I10 Essential (primary) hypertension: Secondary | ICD-10-CM | POA: Insufficient documentation

## 2019-03-17 DIAGNOSIS — Z9842 Cataract extraction status, left eye: Secondary | ICD-10-CM | POA: Diagnosis not present

## 2019-03-17 DIAGNOSIS — R221 Localized swelling, mass and lump, neck: Secondary | ICD-10-CM | POA: Diagnosis present

## 2019-03-17 DIAGNOSIS — Z87442 Personal history of urinary calculi: Secondary | ICD-10-CM | POA: Diagnosis not present

## 2019-03-17 DIAGNOSIS — Z961 Presence of intraocular lens: Secondary | ICD-10-CM | POA: Diagnosis not present

## 2019-03-17 DIAGNOSIS — Z79899 Other long term (current) drug therapy: Secondary | ICD-10-CM | POA: Diagnosis not present

## 2019-03-17 HISTORY — DX: Essential (primary) hypertension: I10

## 2019-03-17 HISTORY — DX: Personal history of urinary calculi: Z87.442

## 2019-03-17 LAB — BASIC METABOLIC PANEL
Anion gap: 11 (ref 5–15)
BUN: <5 mg/dL — ABNORMAL LOW (ref 8–23)
CO2: 26 mmol/L (ref 22–32)
Calcium: 10.3 mg/dL (ref 8.9–10.3)
Chloride: 98 mmol/L (ref 98–111)
Creatinine, Ser: 0.98 mg/dL (ref 0.61–1.24)
GFR calc Af Amer: >60 mL/min (ref >60)
GFR calc non Af Amer: >60 mL/min (ref >60)
Glucose, Bld: 101 mg/dL — ABNORMAL HIGH (ref 70–99)
Potassium: 3.8 mmol/L (ref 3.5–5.1)
Sodium: 135 mmol/L (ref 135–145)

## 2019-03-17 NOTE — Pre-Procedure Instructions (Signed)
Miguel Bennett  03/17/2019     [id number] [place] [place] [address] [address] Phone: [phone] Fax: [phone]   Your procedure is scheduled on Tuesday, March 18, 2019  Report to [facility] Admitting at 5:30 A.M.  Call this number if you have problems the morning of surgery:  [phone]   Remember: Brush your teeth the morning of the procedure with your regular toothpaste.  Do not eat or drink after midnight tonight.  Take these medicines the morning of surgery with A SIP OF WATER :  diltiazem (DILACOR) prednisoLONE acetate (PRED FORTE) eye drops  Stop taking Aspirin (unless otherwise advised by surgeon), vitamins, fish oil and herbal medications. Do not take any NSAIDs ie: Ibuprofen, Advil, Naproxen (Aleve), Motrin, BC and Goody Powder; stop now.    Do not wear jewelry,  Do not wear lotions, powders, or colognes, or deodorant.  Men may shave face and neck.  Do not bring valuables to the hospital.  [facility] is not responsible for any belongings or valuables.  Contacts, eyeglasses, hearing aids, dentures or bridgework may not be worn into surgery.   Patients discharged the day of surgery will not be allowed to drive home.  Special instructions:  [facility]- Preparing For Surgery  Before surgery, you can play an important role. Because skin is not sterile, your skin needs to be as free of germs as possible. You can reduce the number of germs on your skin by washing with CHG (chlorahexidine gluconate) Soap before surgery.  CHG is an antiseptic cleaner which kills germs and bonds with the skin to continue killing germs even after washing.    Oral Hygiene is also important to reduce your risk of infection.  Remember - BRUSH YOUR TEETH THE MORNING OF SURGERY WITH YOUR REGULAR TOOTHPASTE  Please do not use if you have an allergy to CHG or antibacterial soaps. If your skin becomes reddened/irritated stop using  the CHG.  Do not shave (including legs and underarms) for at least 48 hours prior to first CHG shower. It is OK to shave your face.  Please follow these instructions carefully.   1. Shower the NIGHT BEFORE SURGERY and the MORNING OF SURGERY with CHG.   2. If you chose to wash your hair, wash your hair first as usual with your normal shampoo.  3. After you shampoo, rinse your hair and body thoroughly to remove the shampoo.  4. Use CHG as you would any other liquid soap. You can apply CHG directly to the skin and wash gently with a scrungie or a clean washcloth.   5. Apply the CHG Soap to your body ONLY FROM THE NECK DOWN.  Do not use on open wounds or open sores. Avoid contact with your eyes, ears, mouth and genitals (private parts). Wash Face and genitals (private parts)  with your normal soap.  6. Wash thoroughly, paying special attention to the area where your surgery will be performed.  7. Thoroughly rinse your body with warm water from the neck down.  8. DO NOT shower/wash with your normal soap after using and rinsing off the CHG Soap.  9. Pat yourself dry with a CLEAN TOWEL.  10. Wear CLEAN PAJAMAS to bed the night before surgery, wear comfortable clothes the morning of surgery  11. Place CLEAN SHEETS on your bed the night of your first shower and DO NOT SLEEP WITH PETS.    Day of  Surgery: Do not apply any deodorants/lotions.  Please wear clean clothes to the hospital/surgery center.   Remember to brush your teeth WITH YOUR REGULAR TOOTHPASTE.   Please read over the following fact sheets that you were given. Pain Booklet, Coughing and Deep Breathing and Surgical Site Infection Prevention

## 2019-03-17 NOTE — Progress Notes (Signed)
PCP - Dr. Kathryne Eriksson Cardiologist - Patient denies  Chest x-ray - n/a EKG - 03/17/2019 Stress Test - Patient denies ECHO - Patient denies Cardiac Cath - Patient denies  Sleep Study - Patient denies CPAP -   Fasting Blood Sugar - n/a Checks Blood Sugar _____ times a day  Blood Thinner Instructions:n/a Aspirin Instructions:  Anesthesia review: yes, abnormal ekg  Patient denies shortness of breath, fever, cough and chest pain at PAT appointment   Patient verbalized understanding of instructions that were given to them at the PAT appointment. Patient was also instructed that they will need to review over the PAT instructions again at home before surgery.

## 2019-03-17 NOTE — Progress Notes (Addendum)
Anesthesia Chart Review:  Case:  [id number] Date/Time:  03/18/19 0715   Procedure:  DIRECT LARYNGOSCOPY WITH BIOPSY OF RIGHT TONSILLAR MASS (Right )   Anesthesia type:  General   Pre-op diagnosis:  TONSILLAR MASS   Location:  [facility] ROOM 08 / [facility]   Surgeon:  [name], MD      DISCUSSION: Patient is a 68 year old male scheduled for the above procedure. Patient was referred to ENT by PCP Dr. Redmond Pulling. Notes suggest that right neck mass was noted when he was evaluated by neurosurgeon Kristeen Miss, MD for back issues. Dr. Peggyann Juba ordered a neck CT that showed a 4 cm right tonsillar mass felt most consistent with metastatic disease. The above procedure recommended for definitive tissue diagnosis.   History includes former smoker (quit 1981), HTN. Alcohol use is documented on [facility] Epic as 20 standard drinks/week and in ENT notes as 14 shots of liquor/week.   There was a processing issue with CBC specimen, so results could be be resulted. He will need a repeat CBC lab draw on the day of surgery.   Preoperative EKG showed SR, PACs, prolonged QT (QT 470, QTc 510 ms). QT interval has lengthened when compared to 09/05/00 and 01/21/04 tracings in [facility]. K 3.8. I don't see that he is on any medications that are commonly known to increase QT. Defer to anesthesiologist regarding whether to repeat tracing pre- or post-operatively. Anesthesiologist to evaluate on the day of surgery. (ADDENDUM 03/18/19 11:04 AM: Cardiologist reviewed 03/17/19 EKG and confirmed tracing as NSR with QT/QTc 380/413 ms.)     VS: BP (!) 141/97   Pulse 72   Temp 37.1 C (Oral)   Resp 16   Ht 6' (1.829 m)   Wt 62.9 kg   SpO2 100%   BMI 18.79 kg/m  BP 156/93 03/04/19 at ENT.   PROVIDERS: [name], MD is PCP ([place])   LABS:  Unremarkable BMET. K 3.8. As above, new CBC specimen needed on the day of surgery. Normal yearly LFTs for at least five years at PCP office (last 06/18/18).  (all labs ordered are listed, but  only abnormal results are displayed)  Labs Reviewed  BASIC METABOLIC PANEL - Abnormal; Notable for the following components:      Result Value   Glucose, Bld 101 (*)    BUN <5 (*)    All other components within normal limits  CBC    IMAGES: CT soft tissue neck 03/11/19: IMPRESSION: 1. 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 2. Slight asymmetric soft tissue fullness in the right glossotonsillar sulcus without a discrete mass identified. Direct visualization is recommended to assess for a primary mucosal lesion. 3.  Aortic Atherosclerosis (ICD10-I70.0).  US Renal 03/03/19: IMPRESSION: 1. Bilateral benign renal cysts.  No suspicious renal lesions. 2. No other significant abnormality.  No hydronephrosis.   EKG: 03/17/19: Sinus rhythm with Premature atrial complexes Prolonged QT (QT/QTc 470/510 ms) Abnormal ECG - Rate has increased when compared to 01/21/04 tracing in [facility]. EKGs appears more similar to his 09/05/00 tracing in [facility]. QT interval has increased when compared to both tracings. K 3.8.  ADDENDUM: Cardiologist reviewed 03/17/19 EKG and confirmed tracing as NSR with QT/QTc 380/413 ms.  CV: He denied history of echo, stress, and cardiac cath.    Past Medical History:  Diagnosis Date  . History of kidney stones   . Hypertension  Past Surgical History:  Procedure Laterality Date  . CATARACT EXTRACTION W/ INTRAOCULAR LENS  IMPLANT, BILATERAL    . CHOLECYSTECTOMY    . COLONOSCOPY    . POLYPECTOMY     colon  . STOMACH SURGERY    . TONSILLECTOMY      MEDICATIONS: . calcium carbonate (TUMS - DOSED IN MG ELEMENTAL CALCIUM) 500 MG chewable tablet  . cholecalciferol (VITAMIN D3) 25 MCG (1000 UT) tablet  . Cyanocobalamin (VITAMIN B-12 PO)  . diltiazem (DILACOR XR) 180 MG 24 hr capsule  . ferrous sulfate 325 (65 FE) MG tablet  . Multiple Vitamin (MULTIVITAMIN WITH MINERALS) TABS tablet  . prednisoLONE  acetate (PRED FORTE) 1 % ophthalmic suspension  . quinapril-hydrochlorothiazide (ACCURETIC) 20-12.5 MG tablet   No current facility-administered medications for this encounter.     Myra Gianotti, PA-C Surgical Short Stay/Anesthesiology [facility] Phone [phone] [facility] Phone [phone] 03/17/2019 4:56 PM

## 2019-03-17 NOTE — Anesthesia Preprocedure Evaluation (Addendum)
Anesthesia Evaluation  Patient identified by MRN, date of birth, ID band Patient awake    Reviewed: Allergy & Precautions, NPO status , Patient's Chart, lab work & pertinent test results  Airway Mallampati: II  TM Distance: >3 FB Neck ROM: Full    Dental  (+) Teeth Intact, Dental Advisory Given   Pulmonary former smoker,    breath sounds clear to auscultation       Cardiovascular hypertension,  Rhythm:Regular Rate:Normal     Neuro/Psych    GI/Hepatic   Endo/Other    Renal/GU      Musculoskeletal   Abdominal   Peds  Hematology   Anesthesia Other Findings   Reproductive/Obstetrics                            Anesthesia Physical Anesthesia Plan  ASA: III  Anesthesia Plan: General   Post-op Pain Management:    Induction: Intravenous  PONV Risk Score and Plan: Ondansetron and Dexamethasone  Airway Management Planned: Oral ETT  Additional Equipment:   Intra-op Plan:   Post-operative Plan: Extubation in OR  Informed Consent: I have reviewed the patients History and Physical, chart, labs and discussed the procedure including the risks, benefits and alternatives for the proposed anesthesia with the patient or authorized representative who has indicated his/her understanding and acceptance.     Dental advisory given  Plan Discussed with: Anesthesiologist  Anesthesia Plan Comments: (PAT note written 03/17/2019 by Myra Gianotti, PA-C.  )       Anesthesia Quick Evaluation

## 2019-03-18 ENCOUNTER — Ambulatory Visit (HOSPITAL_COMMUNITY)
Admission: RE | Admit: 2019-03-18 | Discharge: 2019-03-18 | Disposition: A | Discharge status: Home / Self Care | Payer: Medicare HMO | Attending: Otolaryngology | Admitting: Otolaryngology

## 2019-03-18 ENCOUNTER — Encounter (HOSPITAL_COMMUNITY)
Admission: RE | Disposition: A | Discharge status: Home / Self Care | Payer: Self-pay | Source: Home / Self Care | Attending: Otolaryngology

## 2019-03-18 ENCOUNTER — Ambulatory Visit (HOSPITAL_COMMUNITY): Payer: Medicare HMO | Admitting: Certified Registered"

## 2019-03-18 ENCOUNTER — Other Ambulatory Visit: Payer: Self-pay

## 2019-03-18 DIAGNOSIS — Z79899 Other long term (current) drug therapy: Secondary | ICD-10-CM | POA: Insufficient documentation

## 2019-03-18 DIAGNOSIS — R221 Localized swelling, mass and lump, neck: Secondary | ICD-10-CM | POA: Diagnosis not present

## 2019-03-18 DIAGNOSIS — Z9049 Acquired absence of other specified parts of digestive tract: Secondary | ICD-10-CM | POA: Insufficient documentation

## 2019-03-18 DIAGNOSIS — I10 Essential (primary) hypertension: Secondary | ICD-10-CM | POA: Diagnosis not present

## 2019-03-18 DIAGNOSIS — Z87891 Personal history of nicotine dependence: Secondary | ICD-10-CM | POA: Insufficient documentation

## 2019-03-18 DIAGNOSIS — Z87442 Personal history of urinary calculi: Secondary | ICD-10-CM | POA: Diagnosis not present

## 2019-03-18 DIAGNOSIS — Z961 Presence of intraocular lens: Secondary | ICD-10-CM | POA: Insufficient documentation

## 2019-03-18 DIAGNOSIS — Z9841 Cataract extraction status, right eye: Secondary | ICD-10-CM | POA: Insufficient documentation

## 2019-03-18 DIAGNOSIS — Z9842 Cataract extraction status, left eye: Secondary | ICD-10-CM | POA: Insufficient documentation

## 2019-03-18 HISTORY — PX: DIRECT LARYNGOSCOPY: SHX5326

## 2019-03-18 LAB — CBC
HCT: 41.6 % (ref 39.0–52.0)
Hemoglobin: 13.5 g/dL (ref 13.0–17.0)
MCH: 34.3 pg — ABNORMAL HIGH (ref 26.0–34.0)
MCHC: 32.5 g/dL (ref 30.0–36.0)
MCV: 105.6 fL — ABNORMAL HIGH (ref 80.0–100.0)
Platelets: 274 10*3/uL (ref 150–400)
RBC: 3.94 MIL/uL — ABNORMAL LOW (ref 4.22–5.81)
RDW: 14.8 % (ref 11.5–15.5)
WBC: 5.3 10*3/uL (ref 4.0–10.5)
nRBC: 0 % (ref 0.0–0.2)

## 2019-03-18 SURGERY — LARYNGOSCOPY, DIRECT
Anesthesia: General | Site: Throat | Laterality: Right

## 2019-03-18 MED ORDER — PROPOFOL 10 MG/ML IV BOLUS
INTRAVENOUS | Status: AC
  Filled 2019-03-18: qty 20

## 2019-03-18 MED ORDER — FENTANYL CITRATE (PF) 100 MCG/2ML IJ SOLN
INTRAMUSCULAR | Status: AC
  Filled 2019-03-18: qty 2

## 2019-03-18 MED ORDER — EPINEPHRINE HCL (NASAL) 0.1 % NA SOLN
NASAL | Status: AC
  Filled 2019-03-18: qty 30

## 2019-03-18 MED ORDER — PROPOFOL 10 MG/ML IV BOLUS
INTRAVENOUS | Status: DC | PRN
  Administered 2019-03-18: 160 mg via INTRAVENOUS

## 2019-03-18 MED ORDER — OXYCODONE HCL 5 MG PO TABS: 5.0000 mg | ORAL_TABLET | Freq: Once | ORAL | Status: DC | PRN

## 2019-03-18 MED ORDER — OXYCODONE HCL 5 MG/5ML PO SOLN: 5.0000 mg | Freq: Once | ORAL | Status: DC | PRN

## 2019-03-18 MED ORDER — SUCCINYLCHOLINE CHLORIDE 20 MG/ML IJ SOLN
INTRAMUSCULAR | Status: DC | PRN
  Administered 2019-03-18: 100 mg via INTRAVENOUS

## 2019-03-18 MED ORDER — 0.9 % SODIUM CHLORIDE (POUR BTL) OPTIME
TOPICAL | Status: DC | PRN
  Administered 2019-03-18: 1000 mL

## 2019-03-18 MED ORDER — ONDANSETRON HCL 4 MG/2ML IJ SOLN
INTRAMUSCULAR | Status: DC | PRN
  Administered 2019-03-18: 4 mg via INTRAVENOUS

## 2019-03-18 MED ORDER — EPINEPHRINE HCL (NASAL) 0.1 % NA SOLN
NASAL | Status: DC | PRN
  Administered 2019-03-18: 1 [drp] via TOPICAL

## 2019-03-18 MED ORDER — DEXAMETHASONE SODIUM PHOSPHATE 10 MG/ML IJ SOLN
INTRAMUSCULAR | Status: DC | PRN
  Administered 2019-03-18: 10 mg via INTRAVENOUS

## 2019-03-18 MED ORDER — LIDOCAINE 2% (20 MG/ML) 5 ML SYRINGE
INTRAMUSCULAR | Status: DC | PRN
  Administered 2019-03-18: 60 mg via INTRAVENOUS

## 2019-03-18 MED ORDER — OXYMETAZOLINE HCL 0.05 % NA SOLN
NASAL | Status: AC
  Filled 2019-03-18: qty 30

## 2019-03-18 MED ORDER — MIDAZOLAM HCL 5 MG/5ML IJ SOLN
INTRAMUSCULAR | Status: DC | PRN
  Administered 2019-03-18: 2 mg via INTRAVENOUS

## 2019-03-18 MED ORDER — ONDANSETRON HCL 4 MG/2ML IJ SOLN: 4.0000 mg | Freq: Once | INTRAMUSCULAR | Status: DC | PRN

## 2019-03-18 MED ORDER — LACTATED RINGERS IV SOLN
INTRAVENOUS | Status: DC | PRN
  Administered 2019-03-18: 07:00:00 via INTRAVENOUS

## 2019-03-18 MED ORDER — FENTANYL CITRATE (PF) 100 MCG/2ML IJ SOLN
INTRAMUSCULAR | Status: DC | PRN
  Administered 2019-03-18: 100 ug via INTRAVENOUS

## 2019-03-18 MED ORDER — MIDAZOLAM HCL 2 MG/2ML IJ SOLN
INTRAMUSCULAR | Status: AC
  Filled 2019-03-18: qty 2

## 2019-03-18 MED ORDER — FENTANYL CITRATE (PF) 100 MCG/2ML IJ SOLN: 25.0000 ug | INTRAMUSCULAR | Status: DC | PRN

## 2019-03-18 SURGICAL SUPPLY — 22 items
CANISTER SUCT 3000ML PPV (MISCELLANEOUS) ×2 IMPLANT
COAGULATOR SUCT 8FR VV (MISCELLANEOUS) ×1 IMPLANT
COVER BACK TABLE 60X90IN (DRAPES) ×2 IMPLANT
COVER MAYO STAND STRL (DRAPES) ×2 IMPLANT
COVER WAND RF STERILE (DRAPES) ×1 IMPLANT
CRADLE DONUT ADULT HEAD (MISCELLANEOUS) ×1 IMPLANT
DRAPE HALF SHEET 40X57 (DRAPES) ×2 IMPLANT
GAUZE 4X4 16PLY RFD (DISPOSABLE) IMPLANT
GLOVE ECLIPSE 7.5 STRL STRAW (GLOVE) ×2 IMPLANT
GUARD TEETH (MISCELLANEOUS) ×1 IMPLANT
KIT BASIN OR (CUSTOM PROCEDURE TRAY) ×2 IMPLANT
KIT TURNOVER KIT B (KITS) ×2 IMPLANT
NDL HYPO 25GX1X1/2 BEV (NEEDLE) IMPLANT
NEEDLE HYPO 25GX1X1/2 BEV (NEEDLE) ×2 IMPLANT
NS IRRIG 1000ML POUR BTL (IV SOLUTION) ×2 IMPLANT
PAD ARMBOARD 7.5X6 YLW CONV (MISCELLANEOUS) ×4 IMPLANT
PATTIES SURGICAL .5 X3 (DISPOSABLE) ×1 IMPLANT
SOLUTION ANTI FOG 6CC (MISCELLANEOUS) ×1 IMPLANT
SPECIMEN JAR SMALL (MISCELLANEOUS) IMPLANT
SURGILUBE 2OZ TUBE FLIPTOP (MISCELLANEOUS) IMPLANT
TOWEL OR 17X24 6PK STRL BLUE (TOWEL DISPOSABLE) ×4 IMPLANT
TUBE CONNECTING 12X1/4 (SUCTIONS) ×2 IMPLANT

## 2019-03-18 NOTE — H&P (Signed)
Miguel Bennett is an 68 y.o. male.   Chief Complaint: throat mass HPI: hx of right neck mass and a right tonsil area mass. Needs biopsy  Past Medical History:  Diagnosis Date  . History of kidney stones   . Hypertension     Past Surgical History:  Procedure Laterality Date  . CATARACT EXTRACTION W/ INTRAOCULAR LENS  IMPLANT, BILATERAL    . CHOLECYSTECTOMY    . COLONOSCOPY    . POLYPECTOMY     colon  . STOMACH SURGERY    . TONSILLECTOMY      No family history on file. Social History:  reports that he quit smoking about 39 years ago. His smoking use included cigarettes. He has a 12.00 pack-year smoking history. He has never used smokeless tobacco. He reports current alcohol use of about 20.0 standard drinks of alcohol per week. He reports that he does not use drugs.  Allergies: No Known Allergies  Medications Prior to Admission  Medication Sig Dispense Refill  . calcium carbonate (TUMS - DOSED IN MG ELEMENTAL CALCIUM) 500 MG chewable tablet Chew 1 tablet by mouth daily as needed for indigestion or heartburn.    . cholecalciferol (VITAMIN D3) 25 MCG (1000 UT) tablet Take 1,000 Units by mouth daily.    . Cyanocobalamin (VITAMIN B-12 PO) Take 3,000 mcg by mouth daily.    Marland Kitchen diltiazem (DILACOR XR) 180 MG 24 hr capsule Take 360 mg by mouth daily.     . ferrous sulfate 325 (65 FE) MG tablet Take 325 mg by mouth daily with breakfast.    . Multiple Vitamin (MULTIVITAMIN WITH MINERALS) TABS tablet Take 1 tablet by mouth daily.    . prednisoLONE acetate (PRED FORTE) 1 % ophthalmic suspension Place 1 drop into the right eye 2 (two) times daily.    . quinapril-hydrochlorothiazide (ACCURETIC) 20-12.5 MG tablet Take 1 tablet by mouth daily.       Results for orders placed or performed during the hospital encounter of 03/18/19 (from the past 48 hour(s))  CBC     Status: Abnormal   Collection Time: 03/18/19  6:08 AM  Result Value Ref Range   WBC 5.3 4.0 - 10.5 K/uL   RBC 3.94 (L) 4.22 - 5.81  MIL/uL   Hemoglobin 13.5 13.0 - 17.0 g/dL   HCT 41.6 39.0 - 52.0 %   MCV 105.6 (H) 80.0 - 100.0 fL   MCH 34.3 (H) 26.0 - 34.0 pg   MCHC 32.5 30.0 - 36.0 g/dL   RDW 14.8 11.5 - 15.5 %   Platelets 274 150 - 400 K/uL   nRBC 0.0 0.0 - 0.2 %    Comment: Performed at [facility], [place] [address] [place], [place] [address]   No results found.  Review of Systems  Constitutional: Negative.   HENT: Negative.   Eyes: Negative.   Respiratory: Negative.   Cardiovascular: Negative.   Skin: Negative.     Blood pressure (!) 165/94, pulse 73, resp. rate 17, height 6' (1.829 m), weight 62.9 kg, SpO2 100 %. Physical Exam  Constitutional: He appears well-developed and well-nourished.  HENT:  Head: Normocephalic and atraumatic.  Nose: Nose normal.  Eyes: Pupils are equal, round, and reactive to light. Conjunctivae are normal.  Neck: Normal range of motion. Neck supple.  Cardiovascular: Normal rate.  Respiratory: Effort normal.  GI: Soft.  Musculoskeletal: Normal range of motion.  Lymphadenopathy:    He has cervical adenopathy.     Assessment/Plan Right neck/tonsil mass- discussed  DL and readuy to proceed  [name], MD 03/18/2019, 7:29 AM

## 2019-03-18 NOTE — Transfer of Care (Signed)
Immediate Anesthesia Transfer of Care Note  Patient: Miguel Bennett  Procedure(s) Performed: DIRECT LARYNGOSCOPY WITH BIOPSY OF RIGHT TONSILLAR MASS (Right Throat)  Patient Location: PACU  Anesthesia Type:General  Level of Consciousness: awake, alert , oriented and patient cooperative  Airway & Oxygen Therapy: Patient Spontanous Breathing and Patient connected to face mask oxygen  Post-op Assessment: Report given to RN and Post -op Vital signs reviewed and stable  Post vital signs: Reviewed and stable  Last Vitals:  Vitals Value Taken Time  BP 154/96 03/18/2019  8:30 AM  Temp    Pulse 66 03/18/2019  8:33 AM  Resp 13 03/18/2019  8:33 AM  SpO2 100 % 03/18/2019  8:33 AM  Vitals shown include unvalidated device data.  Last Pain: There were no vitals filed for this visit.    Patients Stated Pain Goal: 3 (15/04/13 6438)  Complications: No apparent anesthesia complications

## 2019-03-18 NOTE — Anesthesia Postprocedure Evaluation (Signed)
Anesthesia Post Note  Patient: Miguel Bennett  Procedure(s) Performed: DIRECT LARYNGOSCOPY WITH BIOPSY OF RIGHT TONSILLAR MASS (Right Throat)     Patient location during evaluation: PACU Anesthesia Type: General Level of consciousness: awake and alert Pain management: pain level controlled Vital Signs Assessment: post-procedure vital signs reviewed and stable Respiratory status: spontaneous breathing, nonlabored ventilation, respiratory function stable and patient connected to nasal cannula oxygen Cardiovascular status: blood pressure returned to baseline and stable Postop Assessment: no apparent nausea or vomiting Anesthetic complications: no    Last Vitals:  Vitals:   03/18/19 0914 03/18/19 0915  BP:  135/90  Pulse: (!) 54 (!) 55  Resp: 13 11  Temp:  36.5 C  SpO2: 99% 98%    Last Pain:  Vitals:   03/18/19 0915  PainSc: 0-No pain                 Jameire Kouba COKER

## 2019-03-18 NOTE — Anesthesia Procedure Notes (Signed)
Procedure Name: Intubation Date/Time: 03/18/2019 7:50 AM Performed by: Cleda Daub, CRNA Pre-anesthesia Checklist: Patient identified, Emergency Drugs available, Suction available and Patient being monitored Patient Re-evaluated:Patient Re-evaluated prior to induction Oxygen Delivery Method: Circle system utilized Preoxygenation: Pre-oxygenation with 100% oxygen Induction Type: IV induction and Rapid sequence Laryngoscope Size: Glidescope Grade View: Grade I Tube type: Oral Tube size: 6.5 mm Number of attempts: 1 Airway Equipment and Method: Stylet Placement Confirmation: ETT inserted through vocal cords under direct vision,  positive ETCO2 and breath sounds checked- equal and bilateral Secured at: 24 cm Tube secured with: Tape Dental Injury: Teeth and Oropharynx as per pre-operative assessment

## 2019-03-18 NOTE — Op Note (Signed)
Preop/postop diagnosis: Right neck mass Procedure: Direct laryngoscopy with biopsy Anesthesia: General Estimated blood loss: Less than 10 cc Indications: 68 year old with a mass in the right neck that is suspicious for squamous cell carcinoma.  He has a fullness in the right tonsillar fossa area.  He has no pharyngeal symptoms.  He was informed risk and benefits of the procedure and options were discussed all questions were answered and consent was obtained. Procedure: Patient was taken the operating placed supine position after general endotracheal tube anesthesia was placed in the Rose position.  The Dedo scope was used to evaluate the airway.  The hypopharynx and larynx look clear of any mass or lesion.  The palate and pharynx also looked without any lesions.  The area that looked irregular on the right tonsillar fossa area in the clinic did not appear to have any obvious issues by this examination.  Palpation of the tonsillar fossa area and palate did not reveal any mass or lesion.  The base of tongue on the right side which is in the inferior aspect of the tonsil as well did have an irregular papillary looking mass but it was small and soft to palpation.  This area was biopsied multiple times.  Hemostasis achieved with suction cautery after pledgets did not stop the bleeding.  There was good hemostasis.  Patient was awakened brought to recovery in stable condition counts correct

## 2019-03-19 ENCOUNTER — Encounter (HOSPITAL_COMMUNITY): Payer: Self-pay | Admitting: Otolaryngology

## 2019-04-04 ENCOUNTER — Other Ambulatory Visit: Payer: Medicare HMO

## 2019-04-04 ENCOUNTER — Ambulatory Visit: Payer: Medicare HMO | Admitting: Hematology

## 2019-04-08 ENCOUNTER — Telehealth: Payer: Self-pay | Admitting: *Deleted

## 2019-04-08 NOTE — Telephone Encounter (Signed)
Oncology Nurse Navigator Documentation  In follow-up to VMM from [facility], ENT Janace Hoard' office, called Mr. Ruttan to apologize for the misunderstanding re his referral and calls he has been receiving to schedule appts.  LVMM, encouraged him to call me if he wished to speak further.  Gayleen Orem, RN, BSN Head & Neck Oncology Nurse [place] at [place] [phone]

## 2019-06-02 ENCOUNTER — Other Ambulatory Visit: Payer: Self-pay | Admitting: Radiation Oncology

## 2019-06-16 ENCOUNTER — Encounter: Payer: Self-pay | Admitting: Radiation Oncology

## 2019-06-16 ENCOUNTER — Encounter: Payer: Self-pay | Admitting: *Deleted

## 2019-06-16 ENCOUNTER — Telehealth: Payer: Self-pay | Admitting: Radiation Oncology

## 2019-06-16 NOTE — Progress Notes (Signed)
Head and Neck Cancer Location of Tumor / Histology:  03/18/19 Diagnosis Tongue, biopsy, Right Base/ Tonsil Fossa - HIGH GRADE SQUAMOUS DYSPLASIA PRESENT FOCALLY  06/02/19 FINAL PATHOLOGIC DIAGNOSIS MICROSCOPIC EXAMINATION AND DIAGNOSIS  A. 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 LEFT LINGUAL TONSIL, RESECTION: HPV-mediated squamous cell carcinoma. Size of tumor: 4 mm at the maximal dimension Histologic grade: G2 Margin of resection is involved by invasive carcinoma. See comment. C. RIGHT NECK CONTENTS 2A & 3, RESECTION:  Metastatic carcinoma is present in one out of nineteen lymph nodes examined (1/19). Size of metastatic carcinoma: 4.5 cm at the maximal dimension. No extranodal extension of tumor is identified. D. RIGHT NECK CONTENTS LEVEL 4, RESECTION:  No metastatic carcinoma is identified in ten lymph nodes examined (0/10). E. RIGHT NECK CONTENTS LEVEL 2B, RESECTION: No metastatic carcinoma is identified in four lymph nodes examined (0/4). F. ADDITIONAL RIGHT TONGUE BASE, RESECTION: Squamous cell carcinoma is present.   Patient presented to Dr. Janace Hoard on 04/01/19  with symptoms of: a right neck mass which he had for about a month.   Biopsies of bilateral  tonsils revealed: invasive carcinoma with metastatic carcinoma present in one out of nineteen lymph nodes.   Nutrition Status Yes No Comments  Weight changes? [x]  []  He has lost about 10 lbs since first symptoms  Swallowing concerns? []  [x]  He does have throat soreness.   PEG? []  [x]     Referrals Yes No Comments  Social Work? []  [x]    Dentistry? []  [x]    Swallowing therapy? []  [x]    Nutrition? [x]  []  At [facility]  Med/Onc? []  [x]     Safety Issues Yes No Comments  Prior radiation? []  [x]    Pacemaker/ICD? []  [x]    Possible current pregnancy? []  [x]    Is the patient on methotrexate? []  [x]     Tobacco/Marijuana/Snuff/ETOH use: He has a history of drinking alcohol. He is a former smoker quitting in 1981  Past/Anticipated interventions by otolaryngology, if any:  03/18/19 Dr. Janace Hoard Procedure: Direct laryngoscopy with biopsy  06/02/19 Dr. Nicolette Bang Procedures/Surgeries performed during hospitalization:  TRANSORAL ROBOTIC SURGERY - TORS (Right ) neck dissection (Right Neck) LARYNGOSCOPY DIRECT (Airway)  06/11/19 Dr. 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 He underwent neck dissection and bilateral lingual tonsillectomy on 06/02/19. Pathology with 8 mm and 4 mm primary tumors, with positive margins in both sites, no LVI, no PNI, 1/33 nodes (4.5 cm , no ECS)  This path report is challenging, as tumor was not seen in frozen section in the left lingual tonsil nor in the margin on the right during surgery.   I explained that there may or may not be microscopic disease in the tongue base on either side. There may or may not be microscopic lymph node disease on the left   Options were reviewed,  including proceeding with surgery (which would be a re-resection of both lingual tonsillectomy beds to ensure negative margins, and a left neck dissection). That may need to be  followed by RT if nodes show up on the left side.   Alternatively he can undergo RT at this time, to both sides of the neck and tongue base. I do not think he would need chemo, as the lingual tonsillectomy with + margins can be considered an excisional biopsy for these little tumors.   Doing nothing is an option, although I do not recommend this, as the likelihood of recurrence increases significantly. Should he choose this, he would need close observation in clinic and with scans to detect recurrence at hopefully an early stage.   Pros and cons of these approaches were discussed.   He is distraught of course. I asked him not to make any decisions at this time. He would like to meet with Radiation Oncology (Dr Isidore Moos) to discuss. He'll make a decision after that.   As for his wounds, he is healing nicely. Wound care measures were taught to the patient.   I will see him back in 2 weeks     Past/Anticipated interventions by medical oncology, if any:  None scheduled.   Current Complaints / other details:

## 2019-06-16 NOTE — Telephone Encounter (Signed)
Left message for patient to verify webex visit for pre reg °

## 2019-06-17 ENCOUNTER — Telehealth: Payer: Self-pay | Admitting: *Deleted

## 2019-06-17 ENCOUNTER — Encounter: Payer: Self-pay | Admitting: Radiation Oncology

## 2019-06-17 ENCOUNTER — Other Ambulatory Visit: Payer: Self-pay

## 2019-06-17 ENCOUNTER — Ambulatory Visit
Admission: RE | Admit: 2019-06-17 | Discharge: 2019-06-17 | Disposition: A | Discharge status: Home / Self Care | Payer: Medicare HMO | Source: Ambulatory Visit | Attending: Radiation Oncology | Admitting: Radiation Oncology

## 2019-06-17 ENCOUNTER — Encounter: Payer: Self-pay | Admitting: *Deleted

## 2019-06-17 ENCOUNTER — Ambulatory Visit
Admission: RE | Admit: 2019-06-17 | Discharge: 2019-06-17 | Disposition: A | Discharge status: Home / Self Care | Payer: Self-pay | Source: Ambulatory Visit | Attending: Radiation Oncology | Admitting: Radiation Oncology

## 2019-06-17 DIAGNOSIS — C090 Malignant neoplasm of tonsillar fossa: Secondary | ICD-10-CM

## 2019-06-17 DIAGNOSIS — C099 Malignant neoplasm of tonsil, unspecified: Secondary | ICD-10-CM

## 2019-06-17 DIAGNOSIS — C024 Malignant neoplasm of lingual tonsil: Secondary | ICD-10-CM

## 2019-06-17 NOTE — Progress Notes (Signed)
Radiation Oncology         [phone] ________________________________  Initial outpatient Consultation (WebEx due to pandemic precautions)  Name: Miguel Bennett MRN: [id number]  Date: 06/17/2019  DOB: June 25, 1951  CC:[name], MD  Francina Ames, MD   REFERRING PHYSICIAN: Francina Ames, MD  DIAGNOSIS:    ICD-10-CM   1. 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 male who presented with a right neck mass since late February 2020. On 03/04/2019, he met with Dr. Janace Hoard, who indicated that the patient was unaware of the mass until visiting another doctor regarding his back.  Neck CT performed on 03/11/2019 showed: 4 cm right level 2 lymph node with cystic changes, most concerning for metastatic squamous cell carcinoma; slight asymmetric soft tissue fullness in the right glossotonsillar sulcus without a discrete mass identified.  Laryngoscopy with biopsy of right tonsillar mass on 03/18/2019 revealed: high grade squamous dysplasia present focally.  Subsequently, the patient met with Dr. Nicolette Bang on 04/16/2019, who performed biopsy of the right lymph node on 04/28/2019. Pathology from the procedure revealed squamous cell carcinoma, positive for cytokeratin AE1/AE3, p40, and p16.  Pertinent imaging thus far includes PET scan performed on 05/16/2019 revealing focal FDG avidity at the right floor of mouth and tongue base with SUV max measuring up to 6.4 concerning for neoplasm with hypermetabolic right level 2 metastatic lymphadenopathy.   He then proceeded to transoral robotic surgery with neck dissection on 06/02/2019. 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 Of the 33 examined lymph nodes from right neck, 1 was positive (4.5cm), with no extranodal extension identified (1/33).  Swallowing issues, if any: none  Weight Changes: 10lb weight loss  Pain status: off/on low back pain, weaning off pain medication from throat surgery  Tobacco history, if any: former smoker, quit in 1981  ETOH abuse, if any: none currently.  Before surgery, would drink socially, 3-4 cocktails for celebrating.  Prior cancers, if any: none  He last saw Dr. WaNicolette Bangn 06/11/2019. He has been kindly referred to me to discuss radiation treatment options to bilateral neck and oropharynx  [place], very active, maintains his land.  Occasionally rides his motorcycles with his wife.  Needs L5-S1 vertebral surgery for degenerative disease/ broken vertebrae/ nerve pressure.  In 1984 part of stomach removed due to bleeding ulcers.  PREVIOUS RADIATION THERAPY: No  PAST MEDICAL HISTORY:  has a past medical history of History of kidney stones and Hypertension.    PAST SURGICAL HISTORY: Past Surgical History:  Procedure Laterality Date  . CATARACT EXTRACTION W/ INTRAOCULAR LENS  IMPLANT, BILATERAL    . CHOLECYSTECTOMY    . COLONOSCOPY    . DIRECT LARYNGOSCOPY Right 03/18/2019   Procedure: DIRECT LARYNGOSCOPY WITH BIOPSY OF RIGHT TONSILLAR MASS;  Surgeon: ByMelissa MontaneMD;  [facility] Service: ENT;  Laterality: Right;  . POLYPECTOMY     colon  . STOMACH SURGERY  1984   40 % of  stomach removed due to a bleeding ulcer  . TONSILLECTOMY      FAMILY HISTORY: family history is not on file.  SOCIAL HISTORY:  reports that he quit smoking about 39 years ago. His smoking use included cigarettes. He has a 12.00 pack-year smoking history. He has never used smokeless  tobacco. He reports current alcohol use of about 20.0 standard drinks of alcohol per week. He reports that he does not use drugs.  ALLERGIES: Patient has no known allergies.  MEDICATIONS:  Current Outpatient Medications  Medication Sig Dispense Refill  . acetaminophen (TYLENOL) 500 MG tablet Take by mouth.    . calcium carbonate (TUMS - DOSED IN MG ELEMENTAL CALCIUM) 500 MG chewable tablet Chew 1 tablet by mouth daily as needed for indigestion or heartburn.    . celecoxib (CELEBREX) 100 MG capsule 100 mg. Will complete 06/17/19    . cholecalciferol (VITAMIN D3) 25 MCG (1000 UT) tablet Take 1,000 Units by mouth daily.    . Cyanocobalamin (VITAMIN B-12 PO) Take 3,000 mcg by mouth daily.    Marland Kitchen diltiazem (DILACOR XR) 180 MG 24 hr capsule Take 360 mg by mouth daily.     . ferrous sulfate 325 (65 FE) MG tablet Take 325 mg by mouth daily with breakfast.    . Multiple Vitamin (MULTIVITAMIN WITH MINERALS) TABS tablet Take 1 tablet by mouth daily.    Marland Kitchen oxyCODONE (OXY IR/ROXICODONE) 5 MG immediate release tablet TAKE 1 TABLET BY MOUTH EVERY 4 HOURS AS NEEDED FOR UP TO 5 DAYS FOR MODERATE PAIN OR SEVERE PAIN.    Marland Kitchen quinapril-hydrochlorothiazide (ACCURETIC) 20-12.5 MG tablet Take 1 tablet by mouth daily.      No current facility-administered medications for this encounter.     REVIEW OF SYSTEMS:  Notable for that above.   PHYSICAL EXAM:  vitals were not taken for this visit.    Gen: NAD Psychiatric: Judgment and insight are intact. Affect is appropriate.    LABORATORY DATA:  Lab Results  Component Value Date   WBC 5.3 03/18/2019   HGB 13.5 03/18/2019   HCT 41.6 03/18/2019   MCV 105.6 (H) 03/18/2019   PLT 274 03/18/2019   CMP     Component Value Date/Time   NA 135 03/17/2019 1157   K 3.8 03/17/2019 1157   CL 98 03/17/2019 1157   CO2 26 03/17/2019 1157   GLUCOSE 101 (H) 03/17/2019 1157   BUN <5 (L) 03/17/2019 1157   CREATININE 0.98 03/17/2019 1157   CALCIUM 10.3 03/17/2019 1157    GFRNONAA >60 03/17/2019 1157   GFRAA >60 03/17/2019 1157      No results found for: TSH   RADIOGRAPHY: No results found.    IMPRESSION/PLAN:  This is a delightful patient with head and neck cancer.  We discussed his options for curative treatment.  I recommend he either undergo further surgery, or pursue radiotherapy to the oropharynx and bilateral neck over 7 weeks.  I would estimate a 15 to 20% chance that he will have a positive node if he undergoes further surgery to the left neck.  There is also a small chance that Dr. Nicolette Bang may not be able to clear the margins in his throat.  Overall I explained to the patient that if he does pursue surgery there is probably a 20 to 25% chance that he will still need adjuvant radiotherapy.  Nevertheless, I think that pursuing more surgery is probably a slightly preferable option to radiotherapy when we weigh all the risks benefits  and side effects.  That being said, if he does not want to undergo further surgery, radiation is a fine option that will also give him an excellent chance of cure.  We discussed the option of not doing anything other than close observation which is not my recommendation as cancers can come back aggressively and salvage can be difficult.  We discussed the potential risks, benefits, and side effects of radiotherapy. We talked in detail about acute and late effects. We discussed that some of the most bothersome acute effects may be mucositis, dysgeusia, need for feeding tube, salivary changes, skin irritation, hair loss, dehydration, weight loss and fatigue. We talked about late effects which include but are not necessarily limited to dysphagia, hypothyroidism, nerve injury, dental issues, xerostomia, and neck edema. No guarantees of treatment were given.   He would like to think about his options further.  He has our contact information to let us know of his decision.  I discussed the patient in detail with Dr. Nicolette Bang.  I recommended  to the patient that he make his decision in the next week so that if he chooses adjuvant therapy we do not delay it excessively and compromise his prognosis.  Multidisciplinary referrals will need to be made if he chooses radiotherapy including dentistry, speech-language pathology, nutrition, social work.  This encounter was provided by telemedicine platform Webex.  The patient has given verbal consent for this type of encounter and has been advised to only accept a meeting of this type in a secure network environment. The time spent during this encounter was 75 minutes. The attendants for this meeting include Eppie Gibson  and [employer], RN, our Head and Neck Oncology Navigator During the encounter, Eppie Gibson was located at [facility] Radiation Oncology Department.  [name] was located at home.    __________________________________________   Eppie Gibson, MD   This document serves as a record of services personally performed by Eppie Gibson, MD. It was created on her behalf by Wilburn Mylar, a trained medical scribe. The creation of this record is based on the scribe's personal observations and the provider's statements to them. This document has been checked and approved by the attending provider.

## 2019-06-17 NOTE — Telephone Encounter (Signed)
Oncology Nurse Navigator Documentation  Sent fax request to [facility] requesting push to [employer] the following imaging:   05/16/2019 PET HEAD AND NECK CANCER  Notification of successful fax transmission received.  Gayleen Orem, RN, BSN, [place] [facility]ck Oncology Nurse [place] at [place] [phone]

## 2019-06-20 NOTE — Progress Notes (Signed)
A user error has taken place: encounter opened in error, closed for administrative reasons.

## 2019-06-23 NOTE — Progress Notes (Signed)
Oncology Nurse Navigator Documentation  . Met with Mr. Freilich during WebEx initial consult with Dr. Isidore Moos s/p 6/22 TORS for R tonsil/tongue base carcinoma with Dr. Nicolette Bang, [facility].  He was joined by his wife. . Further introduced myself as his Navigator, explained my role as a member of the Care Team.   . Provided introductory explanation of radiation treatment including SIM planning and purpose of Aquaplast head and shoulder mask, showed them example.   Marland Kitchen He indicated he has 7/15 post-surgical follow-up with Dr. Nicolette Bang. . They voiced understanding of tmt options of additional surgery vs RT.  He was encouraged to call me Monday 7/13 with tmt decision to facilitate dental consult. . I  encouraged them to contact me with questions/concerns.  They verbalized understanding of information provided.    Gayleen Orem, RN, BSN Head & Neck Oncology Nurse [place] at [place] [phone]

## 2020-05-18 ENCOUNTER — Other Ambulatory Visit: Payer: Self-pay

## 2020-05-18 ENCOUNTER — Encounter (HOSPITAL_COMMUNITY): Payer: Self-pay

## 2020-05-18 ENCOUNTER — Emergency Department (HOSPITAL_COMMUNITY): Payer: Medicare HMO

## 2020-05-18 ENCOUNTER — Emergency Department (HOSPITAL_COMMUNITY)
Admission: EM | Admit: 2020-05-18 | Discharge: 2020-05-19 | Disposition: A | Discharge status: Home / Self Care | Payer: Medicare HMO | Attending: Emergency Medicine | Admitting: Emergency Medicine

## 2020-05-18 DIAGNOSIS — N390 Urinary tract infection, site not specified: Secondary | ICD-10-CM | POA: Diagnosis not present

## 2020-05-18 DIAGNOSIS — Z87891 Personal history of nicotine dependence: Secondary | ICD-10-CM | POA: Insufficient documentation

## 2020-05-18 DIAGNOSIS — N1330 Unspecified hydronephrosis: Secondary | ICD-10-CM

## 2020-05-18 DIAGNOSIS — N200 Calculus of kidney: Secondary | ICD-10-CM

## 2020-05-18 DIAGNOSIS — B9689 Other specified bacterial agents as the cause of diseases classified elsewhere: Secondary | ICD-10-CM | POA: Insufficient documentation

## 2020-05-18 DIAGNOSIS — I10 Essential (primary) hypertension: Secondary | ICD-10-CM | POA: Diagnosis not present

## 2020-05-18 DIAGNOSIS — N132 Hydronephrosis with renal and ureteral calculous obstruction: Secondary | ICD-10-CM | POA: Diagnosis not present

## 2020-05-18 DIAGNOSIS — R1084 Generalized abdominal pain: Secondary | ICD-10-CM | POA: Diagnosis present

## 2020-05-18 LAB — COMPREHENSIVE METABOLIC PANEL
ALT: 21 U/L (ref 0–44)
AST: 25 U/L (ref 15–41)
Albumin: 3.7 g/dL (ref 3.5–5.0)
Alkaline Phosphatase: 57 U/L (ref 38–126)
Anion gap: 10 (ref 5–15)
BUN: 17 mg/dL (ref 8–23)
CO2: 26 mmol/L (ref 22–32)
Calcium: 8.8 mg/dL — ABNORMAL LOW (ref 8.9–10.3)
Chloride: 93 mmol/L — ABNORMAL LOW (ref 98–111)
Creatinine, Ser: 0.98 mg/dL (ref 0.61–1.24)
GFR calc Af Amer: >60 mL/min (ref >60)
GFR calc non Af Amer: >60 mL/min (ref >60)
Glucose, Bld: 133 mg/dL — ABNORMAL HIGH (ref 70–99)
Potassium: 3.2 mmol/L — ABNORMAL LOW (ref 3.5–5.1)
Sodium: 129 mmol/L — ABNORMAL LOW (ref 135–145)
Total Bilirubin: 0.7 mg/dL (ref 0.3–1.2)
Total Protein: 6.4 g/dL — ABNORMAL LOW (ref 6.5–8.1)

## 2020-05-18 LAB — LIPASE, BLOOD: Lipase: 31 U/L (ref 11–51)

## 2020-05-18 LAB — CBC
HCT: 41.1 % (ref 39.0–52.0)
Hemoglobin: 14.1 g/dL (ref 13.0–17.0)
MCH: 35.2 pg — ABNORMAL HIGH (ref 26.0–34.0)
MCHC: 34.3 g/dL (ref 30.0–36.0)
MCV: 102.5 fL — ABNORMAL HIGH (ref 80.0–100.0)
Platelets: 268 10*3/uL (ref 150–400)
RBC: 4.01 MIL/uL — ABNORMAL LOW (ref 4.22–5.81)
RDW: 12 % (ref 11.5–15.5)
WBC: 10 10*3/uL (ref 4.0–10.5)
nRBC: 0 % (ref 0.0–0.2)

## 2020-05-18 MED ORDER — FENTANYL CITRATE (PF) 100 MCG/2ML IJ SOLN
50.0000 ug | Freq: Once | INTRAMUSCULAR | Status: AC
  Administered 2020-05-19: 50 ug via INTRAVENOUS
  Filled 2020-05-18: qty 2

## 2020-05-18 MED ORDER — ONDANSETRON HCL 4 MG/2ML IJ SOLN
4.0000 mg | Freq: Once | INTRAMUSCULAR | Status: AC
  Administered 2020-05-19: 4 mg via INTRAVENOUS
  Filled 2020-05-18: qty 2

## 2020-05-18 NOTE — ED Triage Notes (Signed)
Pt from home via ems for abdominal pain for 5-6 hours . This happened 1 week ago and lasted 3 hours. Generalized abdominal pain. N/V but none with ems. 18g L AC. 4 zofran. 8 mg morphine.

## 2020-05-19 LAB — URINALYSIS, ROUTINE W REFLEX MICROSCOPIC
Bilirubin Urine: NEGATIVE
Glucose, UA: NEGATIVE mg/dL
Ketones, ur: 20 mg/dL — AB
Leukocytes,Ua: NEGATIVE
Nitrite: NEGATIVE
Protein, ur: NEGATIVE mg/dL
RBC / HPF: >50 RBC/hpf — ABNORMAL HIGH (ref 0–5)
Specific Gravity, Urine: 1.013 (ref 1.005–1.030)
pH: 7 (ref 5.0–8.0)

## 2020-05-19 LAB — TROPONIN I (HIGH SENSITIVITY)
Troponin I (High Sensitivity): 6 ng/L (ref <18)
Troponin I (High Sensitivity): 6 ng/L (ref <18)

## 2020-05-19 MED ORDER — CEPHALEXIN 500 MG PO CAPS
500.0000 mg | ORAL_CAPSULE | Freq: Three times a day (TID) | ORAL | 0 refills | Status: DC
Start: 2020-05-19 — End: 2020-08-13

## 2020-05-19 MED ORDER — CEPHALEXIN 500 MG PO CAPS
500.0000 mg | ORAL_CAPSULE | Freq: Once | ORAL | Status: AC
  Administered 2020-05-19: 500 mg via ORAL
  Filled 2020-05-19: qty 1

## 2020-05-19 MED ORDER — KETOROLAC TROMETHAMINE 30 MG/ML IJ SOLN
15.0000 mg | Freq: Once | INTRAMUSCULAR | Status: AC
  Administered 2020-05-19: 15 mg via INTRAVENOUS
  Filled 2020-05-19: qty 1

## 2020-05-19 MED ORDER — TAMSULOSIN HCL 0.4 MG PO CAPS
ORAL_CAPSULE | ORAL | 0 refills | Status: DC
Start: 2020-05-19 — End: 2022-05-18

## 2020-05-19 MED ORDER — ONDANSETRON HCL 4 MG PO TABS
4.0000 mg | ORAL_TABLET | Freq: Three times a day (TID) | ORAL | 0 refills | Status: DC | PRN
Start: 2020-05-19 — End: 2020-08-13

## 2020-05-19 NOTE — ED Provider Notes (Signed)
[facility] EMERGENCY DEPARTMENT Provider Note   CSN: [id number] Arrival date & time: 05/18/20  2115   Time seen 11:35 PM  History Chief Complaint  Patient presents with  . Abdominal Pain    Miguel Bennett is a 69 y.o. male.  HPI   Patient states about 11:30 PM this evening he was watching TV and he started getting lower abdominal discomfort that has been there constantly.  He describes it as sharp.  He states nothing he does makes it hurt more, nothing he does makes it feel better.  He has had nausea and vomited 3-4 times without hematemesis.  He denies diarrhea.  He states last week he had similar pain but it was mainly in the left lower quadrant and it lasted a few hours.  He had a leftover pain pill from prior surgery that he took which seemed to make it go away.  He states prior to his pain starting today he had eaten a cheeseburger.  He has not noted any food intolerance in the past.  He also complains of abdominal bloating.  He has had colonoscopy in the past by Dr. Hassell Done in [place] and states he had several polyps and diverticulosis.  He is status post cholecystectomy and had surgery many years ago due to ulcer disease.  He states earlier in the day he had pain in his right flank and the pain seemed to be more on his right side.  PCP [name], MD   Past Medical History:  Diagnosis Date  . History of kidney stones   . Hypertension     Patient Active Problem List   Diagnosis Date Noted  . 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 CATARACT EXTRACTION W/ INTRAOCULAR LENS  IMPLANT, BILATERAL    . CHOLECYSTECTOMY    . COLONOSCOPY    . DIRECT LARYNGOSCOPY Right 03/18/2019   Procedure: DIRECT LARYNGOSCOPY WITH BIOPSY OF RIGHT TONSILLAR MASS;  Surgeon: [name], MD;  Location: [place];  Service: ENT;  Laterality: Right;  . POLYPECTOMY     colon  . STOMACH SURGERY  1984   40 % of stomach removed due to a  bleeding ulcer  . TONSILLECTOMY         History reviewed. No pertinent family history.  Social History   Tobacco Use  . Smoking status: Former Smoker    Packs/day: 1.00    Years: 12.00    Pack years: 12.00    Types: Cigarettes    Quit date: 1981    Years since quitting: 40.4  . Smokeless tobacco: Never Used  Substance Use Topics  . Alcohol use: Not Currently    Alcohol/week: 20.0 standard drinks    Types: 20 Standard drinks or equivalent per week    Comment: not in a couple of weeks. 06/17/19  . Drug use: Never    Home Medications Prior to Admission medications   Medication Sig Start Date End Date Taking? Authorizing Provider  calcium carbonate (TUMS - DOSED IN MG ELEMENTAL CALCIUM) 500 MG chewable tablet Chew 1 tablet by mouth daily as needed for indigestion or heartburn.   Yes [provider]  Cholecalciferol (VITAMIN D) 50 MCG (2000 UT) tablet Take 2,000 Units by mouth daily.    Yes [provider]  Cyanocobalamin (VITAMIN B-12 PO) Take 1 tablet by mouth daily.    Yes [provider]  diclofenac Sodium (VOLTAREN) 1 % GEL Apply  2 g topically daily as needed. Applied to neck area   Yes [provider]  diltiazem (DILACOR XR) 180 MG 24 hr capsule Take 360 mg by mouth daily.    Yes [provider]  famotidine (PEPCID) 20 MG tablet Take 20 mg by mouth daily.   Yes [provider]  ferrous sulfate 325 (65 FE) MG tablet Take 325 mg by mouth daily with breakfast.   Yes [provider]  Multiple Vitamin (MULTIVITAMIN WITH MINERALS) TABS tablet Take 1 tablet by mouth daily.   Yes [provider]  quinapril-hydrochlorothiazide (ACCURETIC) 20-12.5 MG tablet Take 1 tablet by mouth daily.    Yes [provider]  cephALEXin (KEFLEX) 500 MG capsule Take 1 capsule (500 mg total) by mouth 3 (three) times daily. 05/19/20   Rolland Porter, MD  ondansetron (ZOFRAN) 4 MG tablet Take 1 tablet (4 mg total) by mouth every 8  (eight) hours as needed for nausea or vomiting. 05/19/20   Rolland Porter, MD  tamsulosin (FLOMAX) 0.4 MG CAPS capsule Take 1 po QD until you pass the stone. 05/19/20   Rolland Porter, MD    Allergies    Patient has no known allergies.  Review of Systems   Review of Systems  All other systems reviewed and are negative.   Physical Exam Updated Vital Signs BP (!) 153/93 (BP Location: Right Arm)   Pulse 62   Temp 98.4 F (36.9 C) (Oral)   Resp 12   Ht 6' (1.829 m)   Wt 62.9 kg   SpO2 99%   BMI 18.81 kg/m   Physical Exam Vitals and nursing note reviewed.  Constitutional:      Appearance: Normal appearance. He is normal weight.  HENT:     Head: Normocephalic and atraumatic.     Right Ear: External ear normal.     Left Ear: External ear normal.     Nose: Nose normal.     Mouth/Throat:     Mouth: Mucous membranes are dry.  Eyes:     Extraocular Movements: Extraocular movements intact.     Conjunctiva/sclera: Conjunctivae normal.     Pupils: Pupils are equal, round, and reactive to light.  Cardiovascular:     Rate and Rhythm: Normal rate and regular rhythm.  Pulmonary:     Effort: Pulmonary effort is normal. No respiratory distress.     Breath sounds: Normal breath sounds.  Abdominal:     General: There is distension.     Comments: Patient has a large midline old surgical scar that is well-healed.  He appears to have some abdominal bloating.  He has increased bowel sounds.  He has diffuse tenderness of his lower abdomen.  There is no guarding or rebound.  Musculoskeletal:     Cervical back: Normal range of motion.  Neurological:     Mental Status: He is alert.     ED Results / Procedures / Treatments   Labs (all labs ordered are listed, but only abnormal results are displayed) Results for orders placed or performed during the hospital encounter of 05/18/20  Lipase, blood  Result Value Ref Range   Lipase 31 11 - 51 U/L  Comprehensive metabolic panel  Result Value Ref Range     Sodium 129 (L) 135 - 145 mmol/L   Potassium 3.2 (L) 3.5 - 5.1 mmol/L   Chloride 93 (L) 98 - 111 mmol/L   CO2 26 22 - 32 mmol/L   Glucose, Bld 133 (H) 70 - 99 mg/dL  BUN 17 8 - 23 mg/dL   Creatinine, Ser 0.98 0.61 - 1.24 mg/dL   Calcium 8.8 (L) 8.9 - 10.3 mg/dL   Total Protein 6.4 (L) 6.5 - 8.1 g/dL   Albumin 3.7 3.5 - 5.0 g/dL   AST 25 15 - 41 U/L   ALT 21 0 - 44 U/L   Alkaline Phosphatase 57 38 - 126 U/L   Total Bilirubin 0.7 0.3 - 1.2 mg/dL   GFR calc non Af Amer >60 >60 mL/min   GFR calc Af Amer >60 >60 mL/min   Anion gap 10 5 - 15  CBC  Result Value Ref Range   WBC 10.0 4.0 - 10.5 K/uL   RBC 4.01 (L) 4.22 - 5.81 MIL/uL   Hemoglobin 14.1 13.0 - 17.0 g/dL   HCT 41.1 39.0 - 52.0 %   MCV 102.5 (H) 80.0 - 100.0 fL   MCH 35.2 (H) 26.0 - 34.0 pg   MCHC 34.3 30.0 - 36.0 g/dL   RDW 12.0 11.5 - 15.5 %   Platelets 268 150 - 400 K/uL   nRBC 0.0 0.0 - 0.2 %  Urinalysis, Routine w reflex microscopic  Result Value Ref Range   Color, Urine YELLOW YELLOW   APPearance CLEAR CLEAR   Specific Gravity, Urine 1.013 1.005 - 1.030   pH 7.0 5.0 - 8.0   Glucose, UA NEGATIVE NEGATIVE mg/dL   Hgb urine dipstick LARGE (A) NEGATIVE   Bilirubin Urine NEGATIVE NEGATIVE   Ketones, ur 20 (A) NEGATIVE mg/dL   Protein, ur NEGATIVE NEGATIVE mg/dL   Nitrite NEGATIVE NEGATIVE   Leukocytes,Ua NEGATIVE NEGATIVE   RBC / HPF >50 (H) 0 - 5 RBC/hpf   WBC, UA 11-20 0 - 5 WBC/hpf   Bacteria, UA RARE (A) NONE SEEN  Troponin I (High Sensitivity)  Result Value Ref Range   Troponin I (High Sensitivity) 6 <18 ng/L  Troponin I (High Sensitivity)  Result Value Ref Range   Troponin I (High Sensitivity) 6 <18 ng/L   Laboratory interpretation all normal except hyponatremia, hypokalemia, nonfasting hyperglycemia, malnutrition, elevation MCV consistent with folic acid or vitamin Z61 deficiency, possible UTI, delta troponin is negative    EKG EKG Interpretation  Date/Time:  Tuesday May 18 2020 23:24:51  EDT Ventricular Rate:  70 PR Interval:    QRS Duration: 89 QT Interval:  438 QTC Calculation: 463 R Axis:   56 Text Interpretation: Sinus rhythm Atrial premature complex Since last tracing 17 Mar 2019 Minimal ST depression Minimal ST elevation, lateral leads Baseline wander in lead(s) V6 Confirmed by Rolland Porter [phone]) on 05/18/2020 11:44:42 PM   Radiology CT Renal Stone Study  Result Date: 05/19/2020 CLINICAL DATA:  Right-sided flank pain. EXAM: CT ABDOMEN AND PELVIS WITHOUT CONTRAST TECHNIQUE: Multidetector CT imaging of the abdomen and pelvis was performed following the standard protocol without IV contrast. COMPARISON:  None. FINDINGS: Lower chest: There is a 1 cm pulmonary nodule in the right lower lobe (axial series 4, image 23).The heart size is normal. Hepatobiliary: The liver is normal. Status post cholecystectomy.There is no biliary ductal dilation. Pancreas: Normal contours without ductal dilatation. No peripancreatic fluid collection. Spleen: Unremarkable. Adrenals/Urinary Tract: --Adrenal glands: Unremarkable. --Right kidney/ureter: There is moderate to severe right-sided hydronephrosis. There are multiple nonobstructing stones. There is extensive perinephric fat stranding. --Left kidney/ureter: Multiple cysts are noted. There is a nonobstructing stone in the lower pole. --Urinary bladder: Unremarkable. Stomach/Bowel: --Stomach/Duodenum: There is a small hiatal hernia. Surgical clips are noted at the GE junction. --Small  bowel: Unremarkable. --Colon: Rectosigmoid diverticulosis without acute inflammation. --Appendix: Normal. Vascular/Lymphatic: Atherosclerotic calcification is present within the non-aneurysmal abdominal aorta, without hemodynamically significant stenosis. --No retroperitoneal lymphadenopathy. --No mesenteric lymphadenopathy. --No pelvic or inguinal lymphadenopathy. Reproductive: Unremarkable Other: No ascites or free air. The abdominal wall is normal. Musculoskeletal. 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 There are degenerative changes throughout the lumbar spine without evidence for an acute displaced fracture. IMPRESSION: 1. Moderate to severe right-sided hydronephrosis with extensive perinephric fat stranding. There are multiple nonobstructing stones in the right kidney. Findings may be secondary to a recently passed stone or a right-sided UPJ obstruction. 2. Nonobstructing left-sided nephrolithiasis. Multiple large bilateral renal cysts are noted. 3. There is a 1 cm pulmonary nodule in the right lower lobe. Consider one of the following in 3 months for both low-risk and high-risk individuals: (a) repeat chest CT, (b) follow-up PET-CT, or (c) tissue sampling. This recommendation follows the consensus statement: Guidelines for Management of Incidental Pulmonary Nodules Detected on CT Images: From the Fleischner Society 2017; Radiology 2017; 284:228-243. 4. Rectosigmoid diverticulosis without acute inflammation. 5. 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 Aortic Atherosclerosis (ICD10-I70.0). Electronically Signed   By: Constance Holster M.D.   On: 05/19/2020 00:51    Procedures Procedures (including critical care time)  Medications Ordered in ED Medications  cephALEXin (KEFLEX) capsule 500 mg (has no administration in time range)  fentaNYL (SUBLIMAZE) injection 50 mcg (50 mcg Intravenous Given 05/19/20 0005)  ondansetron (ZOFRAN) injection 4 mg (4 mg Intravenous Given 05/19/20 0005)  ketorolac (TORADOL) 30 MG/ML injection 15 mg (15 mg Intravenous Given 05/19/20 0257)    ED Course  I have reviewed the triage vital signs and the nursing notes.  Pertinent labs & imaging results that were available during my care of the patient were reviewed by me and considered in my medical decision making (see chart for details).    MDM Rules/Calculators/A&P                       Patient initially told me his abdominal pain was diffuse however later on he told me it started in his right flank it was mainly on the right side.  CT renal was done to look for renal stone.  Patient was given IV pain and nausea medication.  After I reviewed his CT he was given Toradol low dose due to his age for possible renal stone.  3:30 AM I discussed his test results.  Patient may have a UTI, there are some increased white blood cells and bacteria seen in his urine.  He was started on Keflex.  He states his urologist has subsequently retired.  However he wants to see [facility] in [place], not [place].  He will be given that number.  Patient states his pain is much improved and he feels ready to be discharged.   Final Clinical Impression(s) / ED Diagnoses Final diagnoses:  Urinary tract infection with hematuria, site unspecified  Hydronephrosis, unspecified hydronephrosis type  Bilateral renal stones    Rx / DC Orders ED Discharge Orders         Ordered    cephALEXin (KEFLEX) 500 MG capsule  3 times daily     05/19/20 0412    tamsulosin (FLOMAX) 0.4 MG CAPS capsule     05/19/20 0412    ondansetron (ZOFRAN) 4 MG  tablet  Every 8 hours PRN     05/19/20 0412          Plan discharge  Rolland Porter, MD, Barbette Or, MD 05/19/20 [phone]

## 2020-05-19 NOTE — ED Notes (Signed)
Pt to CT

## 2020-05-19 NOTE — Discharge Instructions (Addendum)
Drink plenty of fluids.  Take the medications as prescribed.  You can take ibuprofen 400 mg every 8 hours if needed for pain or soreness.  Please call [facility] this morning to get a follow-up appointment.  Tell them you have kidney stones in both kidneys, a urinary tract infection, and your ureter which is the tube that drains your kidney is very enlarged.  Return to the emergency department if you get fever or have uncontrolled vomiting or pain.

## 2020-07-22 ENCOUNTER — Other Ambulatory Visit: Payer: Self-pay | Admitting: Neurological Surgery

## 2020-08-06 NOTE — Progress Notes (Signed)
[id number] - [place], [place] [address] [place] [address] Phone: [phone] Fax: [phone]      Your procedure is scheduled on 08/12/20.  Report to [facility] Main Entrance "A" at 5:30 A.M., and check in at the Admitting office.  Call this number if you have problems the morning of surgery:  [phone]  Call [phone] if you have any questions prior to your surgery date Monday-Friday 8am-4pm    Remember:  Do not eat or drink after midnight the night before your surgery    Take these medicines the morning of surgery with A SIP OF WATER: diltiazem (DILACOR XR)  Carboxymethylcellul-Glycerin (LUBRICATING EYE DROPS OP) - as needed famotidine (PEPCID) - as needed HYDROcodone-acetaminophen (NORCO/VICODIN) - as needed  As of today, STOP taking any Aspirin (unless otherwise instructed by your surgeon) Aleve, Naproxen, Ibuprofen, Motrin, Advil, Goody's, BC's, all herbal medications, fish oil, and all vitamins.                      Do not wear jewelry            Do not wear lotions, powders, colognes, or deodorant.            Do not shave 48 hours prior to surgery.  Men may shave face and neck.            Do not bring valuables to the hospital.            [facility] is not responsible for any belongings or valuables.  Do NOT Smoke (Tobacco/Vaping) or drink Alcohol 24 hours prior to your procedure If you use a CPAP at night, you may bring all equipment for your overnight stay.   Contacts, glasses, dentures or bridgework may not be worn into surgery.      For patients admitted to the hospital, discharge time will be determined by your treatment team.   Patients discharged the day of surgery will not be allowed to drive home, and someone needs to stay with them for 24 hours.    Special instructions:   [place]- Preparing For Surgery  Before surgery, you can play an important role. Because skin is not sterile, your skin needs  to be as free of germs as possible. You can reduce the number of germs on your skin by washing with CHG (chlorahexidine gluconate) Soap before surgery.  CHG is an antiseptic cleaner which kills germs and bonds with the skin to continue killing germs even after washing.    Oral Hygiene is also important to reduce your risk of infection.  Remember - BRUSH YOUR TEETH THE MORNING OF SURGERY WITH YOUR REGULAR TOOTHPASTE  Please do not use if you have an allergy to CHG or antibacterial soaps. If your skin becomes reddened/irritated stop using the CHG.  Do not shave (including legs and underarms) for at least 48 hours prior to first CHG shower. It is OK to shave your face.  Please follow these instructions carefully.   1. Shower the NIGHT BEFORE SURGERY and the MORNING OF SURGERY with CHG Soap.   2. If you chose to wash your hair, wash your hair first as usual with your normal shampoo.  3. After you shampoo, rinse your hair and body thoroughly to remove the shampoo.  4. Use CHG as you would any other liquid soap. You can apply CHG directly to the skin and wash gently with a scrungie or a clean washcloth.  5. Apply the CHG Soap to your body ONLY FROM THE NECK DOWN.  Do not use on open wounds or open sores. Avoid contact with your eyes, ears, mouth and genitals (private parts). Wash Face and genitals (private parts)  with your normal soap.   6. Wash thoroughly, paying special attention to the area where your surgery will be performed.  7. Thoroughly rinse your body with warm water from the neck down.  8. DO NOT shower/wash with your normal soap after using and rinsing off the CHG Soap.  9. Pat yourself dry with a CLEAN TOWEL.  10. Wear CLEAN PAJAMAS to bed the night before surgery  11. Place CLEAN SHEETS on your bed the night of your first shower and DO NOT SLEEP WITH PETS.   Day of Surgery: Wear Clean/Comfortable clothing the morning of surgery Do not apply any deodorants/lotions.    Remember to brush your teeth WITH YOUR REGULAR TOOTHPASTE.   Please read over the following fact sheets that you were given.

## 2020-08-09 ENCOUNTER — Encounter (HOSPITAL_COMMUNITY): Payer: Self-pay

## 2020-08-09 ENCOUNTER — Other Ambulatory Visit: Payer: Self-pay

## 2020-08-09 ENCOUNTER — Other Ambulatory Visit (HOSPITAL_COMMUNITY)
Admission: RE | Admit: 2020-08-09 | Discharge: 2020-08-09 | Disposition: A | Discharge status: Home / Self Care | Payer: Medicare HMO | Source: Ambulatory Visit | Attending: Neurological Surgery | Admitting: Neurological Surgery

## 2020-08-09 ENCOUNTER — Encounter (HOSPITAL_COMMUNITY)
Admission: RE | Admit: 2020-08-09 | Discharge: 2020-08-09 | Disposition: A | Discharge status: Home / Self Care | Payer: Medicare HMO | Source: Ambulatory Visit | Attending: Neurological Surgery | Admitting: Neurological Surgery

## 2020-08-09 DIAGNOSIS — Z01812 Encounter for preprocedural laboratory examination: Secondary | ICD-10-CM | POA: Insufficient documentation

## 2020-08-09 DIAGNOSIS — Z20822 Contact with and (suspected) exposure to covid-19: Secondary | ICD-10-CM | POA: Insufficient documentation

## 2020-08-09 HISTORY — DX: Malignant (primary) neoplasm, unspecified: C80.1

## 2020-08-09 HISTORY — DX: Other symptoms and signs involving the musculoskeletal system: R29.898

## 2020-08-09 HISTORY — DX: Gastro-esophageal reflux disease without esophagitis: K21.9

## 2020-08-09 LAB — BASIC METABOLIC PANEL
Anion gap: 10 (ref 5–15)
BUN: 15 mg/dL (ref 8–23)
CO2: 27 mmol/L (ref 22–32)
Calcium: 9.8 mg/dL (ref 8.9–10.3)
Chloride: 101 mmol/L (ref 98–111)
Creatinine, Ser: 0.96 mg/dL (ref 0.61–1.24)
GFR calc Af Amer: >60 mL/min (ref >60)
GFR calc non Af Amer: >60 mL/min (ref >60)
Glucose, Bld: 97 mg/dL (ref 70–99)
Potassium: 3.9 mmol/L (ref 3.5–5.1)
Sodium: 138 mmol/L (ref 135–145)

## 2020-08-09 LAB — CBC
HCT: 41.8 % (ref 39.0–52.0)
Hemoglobin: 13.5 g/dL (ref 13.0–17.0)
MCH: 33.7 pg (ref 26.0–34.0)
MCHC: 32.3 g/dL (ref 30.0–36.0)
MCV: 104.2 fL — ABNORMAL HIGH (ref 80.0–100.0)
Platelets: 356 10*3/uL (ref 150–400)
RBC: 4.01 MIL/uL — ABNORMAL LOW (ref 4.22–5.81)
RDW: 12.4 % (ref 11.5–15.5)
WBC: 5.5 10*3/uL (ref 4.0–10.5)
nRBC: 0 % (ref 0.0–0.2)

## 2020-08-09 LAB — TYPE AND SCREEN
ABO/RH(D): A POS
Antibody Screen: NEGATIVE

## 2020-08-09 LAB — SURGICAL PCR SCREEN
MRSA, PCR: NEGATIVE
Staphylococcus aureus: POSITIVE — AB

## 2020-08-09 LAB — SARS CORONAVIRUS 2 (TAT 6-24 HRS): SARS Coronavirus 2: NEGATIVE

## 2020-08-09 NOTE — Progress Notes (Signed)
PCP - fred wilson Cardiologist - na  Chest x-ray - na EKG - 05/19/20 Stress Test - na ECHO - na Cardiac Cath - na  Sleep Study - na CPAP -   Fasting Blood Sugar - na   Blood Thinner Instructions:na Aspirin Instructions: na   COVID TEST- 08/09/20   Anesthesia review:   Patient denies shortness of breath, fever, cough and chest pain at PAT appointment   All instructions explained to the patient, with a verbal understanding of the material. Patient agrees to go over the instructions while at home for a better understanding. Patient also instructed to self quarantine after being tested for COVID-19. The opportunity to ask questions was provided.

## 2020-08-12 ENCOUNTER — Inpatient Hospital Stay (HOSPITAL_COMMUNITY)
Admission: RE | Admit: 2020-08-12 | Discharge: 2020-08-13 | DRG: 455 | Disposition: A | Discharge status: Home / Self Care | Payer: Medicare HMO | Attending: Neurological Surgery | Admitting: Neurological Surgery

## 2020-08-12 ENCOUNTER — Inpatient Hospital Stay (HOSPITAL_COMMUNITY): Payer: Medicare HMO

## 2020-08-12 ENCOUNTER — Encounter (HOSPITAL_COMMUNITY)
Admission: RE | Disposition: A | Discharge status: Home / Self Care | Payer: Self-pay | Source: Home / Self Care | Attending: Neurological Surgery

## 2020-08-12 ENCOUNTER — Inpatient Hospital Stay (HOSPITAL_COMMUNITY): Payer: Medicare HMO | Admitting: Certified Registered Nurse Anesthetist

## 2020-08-12 ENCOUNTER — Encounter (HOSPITAL_COMMUNITY): Payer: Self-pay | Admitting: Neurological Surgery

## 2020-08-12 ENCOUNTER — Other Ambulatory Visit: Payer: Self-pay

## 2020-08-12 DIAGNOSIS — Z20822 Contact with and (suspected) exposure to covid-19: Secondary | ICD-10-CM | POA: Diagnosis present

## 2020-08-12 DIAGNOSIS — M4317 Spondylolisthesis, lumbosacral region: Secondary | ICD-10-CM | POA: Diagnosis present

## 2020-08-12 DIAGNOSIS — Z923 Personal history of irradiation: Secondary | ICD-10-CM

## 2020-08-12 DIAGNOSIS — I10 Essential (primary) hypertension: Secondary | ICD-10-CM | POA: Diagnosis present

## 2020-08-12 DIAGNOSIS — K219 Gastro-esophageal reflux disease without esophagitis: Secondary | ICD-10-CM | POA: Diagnosis present

## 2020-08-12 DIAGNOSIS — Z881 Allergy status to other antibiotic agents status: Secondary | ICD-10-CM | POA: Diagnosis not present

## 2020-08-12 DIAGNOSIS — M5416 Radiculopathy, lumbar region: Principal | ICD-10-CM | POA: Diagnosis present

## 2020-08-12 DIAGNOSIS — M419 Scoliosis, unspecified: Secondary | ICD-10-CM | POA: Diagnosis present

## 2020-08-12 DIAGNOSIS — Z888 Allergy status to other drugs, medicaments and biological substances status: Secondary | ICD-10-CM | POA: Diagnosis not present

## 2020-08-12 DIAGNOSIS — Z87891 Personal history of nicotine dependence: Secondary | ICD-10-CM

## 2020-08-12 DIAGNOSIS — Z419 Encounter for procedure for purposes other than remedying health state, unspecified: Secondary | ICD-10-CM

## 2020-08-12 DIAGNOSIS — Z85819 Personal history of malignant neoplasm of unspecified site of lip, oral cavity, and pharynx: Secondary | ICD-10-CM | POA: Diagnosis not present

## 2020-08-12 DIAGNOSIS — Z9221 Personal history of antineoplastic chemotherapy: Secondary | ICD-10-CM | POA: Diagnosis not present

## 2020-08-12 LAB — ABO/RH: ABO/RH(D): A POS

## 2020-08-12 SURGERY — POSTERIOR LUMBAR FUSION 1 LEVEL
Anesthesia: General | Site: Back

## 2020-08-12 MED ORDER — LIDOCAINE 2% (20 MG/ML) 5 ML SYRINGE
INTRAMUSCULAR | Status: AC
  Filled 2020-08-12: qty 5

## 2020-08-12 MED ORDER — FAMOTIDINE 20 MG PO TABS: 20.0000 mg | ORAL_TABLET | Freq: Every day | ORAL | Status: DC | PRN

## 2020-08-12 MED ORDER — ONDANSETRON HCL 4 MG/2ML IJ SOLN
INTRAMUSCULAR | Status: AC
  Filled 2020-08-12: qty 2

## 2020-08-12 MED ORDER — DEXAMETHASONE SODIUM PHOSPHATE 10 MG/ML IJ SOLN
INTRAMUSCULAR | Status: AC
  Filled 2020-08-12: qty 1

## 2020-08-12 MED ORDER — PROPOFOL 10 MG/ML IV BOLUS
INTRAVENOUS | Status: AC
  Filled 2020-08-12: qty 40

## 2020-08-12 MED ORDER — THROMBIN 5000 UNITS EX SOLR
CUTANEOUS | Status: AC
  Filled 2020-08-12: qty 5000

## 2020-08-12 MED ORDER — PHENYLEPHRINE 40 MCG/ML (10ML) SYRINGE FOR IV PUSH (FOR BLOOD PRESSURE SUPPORT)
PREFILLED_SYRINGE | INTRAVENOUS | Status: AC
  Filled 2020-08-12: qty 10

## 2020-08-12 MED ORDER — MORPHINE SULFATE (PF) 2 MG/ML IV SOLN: 1.0000 mg | INTRAVENOUS | Status: DC | PRN

## 2020-08-12 MED ORDER — QUINAPRIL-HYDROCHLOROTHIAZIDE 20-12.5 MG PO TABS: 0.5000 | ORAL_TABLET | Freq: Every day | ORAL | Status: DC

## 2020-08-12 MED ORDER — FENTANYL CITRATE (PF) 100 MCG/2ML IJ SOLN: 25.0000 ug | INTRAMUSCULAR | Status: DC | PRN

## 2020-08-12 MED ORDER — SODIUM CHLORIDE 0.9% FLUSH: 3.0000 mL | INTRAVENOUS | Status: DC | PRN

## 2020-08-12 MED ORDER — KETOROLAC TROMETHAMINE 15 MG/ML IJ SOLN
7.5000 mg | Freq: Four times a day (QID) | INTRAMUSCULAR | Status: AC
  Administered 2020-08-12 – 2020-08-13 (×4): 7.5 mg via INTRAVENOUS
  Filled 2020-08-12 (×4): qty 1

## 2020-08-12 MED ORDER — OXYCODONE HCL 5 MG PO TABS: 5.0000 mg | ORAL_TABLET | Freq: Once | ORAL | Status: DC | PRN

## 2020-08-12 MED ORDER — ACETAMINOPHEN 10 MG/ML IV SOLN
INTRAVENOUS | Status: DC | PRN
  Administered 2020-08-12: 1000 mg via INTRAVENOUS

## 2020-08-12 MED ORDER — SODIUM CHLORIDE 0.9% FLUSH
3.0000 mL | Freq: Two times a day (BID) | INTRAVENOUS | Status: DC
  Administered 2020-08-12: 3 mL via INTRAVENOUS

## 2020-08-12 MED ORDER — SODIUM CHLORIDE 0.9 % IV SOLN
250.0000 mL | INTRAVENOUS | Status: DC
  Administered 2020-08-12: 250 mL via INTRAVENOUS

## 2020-08-12 MED ORDER — OXYCODONE HCL 5 MG/5ML PO SOLN: 5.0000 mg | Freq: Once | ORAL | Status: DC | PRN

## 2020-08-12 MED ORDER — LACTATED RINGERS IV SOLN: INTRAVENOUS | Status: DC | PRN

## 2020-08-12 MED ORDER — EPHEDRINE SULFATE-NACL 50-0.9 MG/10ML-% IV SOSY
PREFILLED_SYRINGE | INTRAVENOUS | Status: DC | PRN
  Administered 2020-08-12: 5 mg via INTRAVENOUS

## 2020-08-12 MED ORDER — PHENOL 1.4 % MT LIQD: 1.0000 | OROMUCOSAL | Status: DC | PRN

## 2020-08-12 MED ORDER — ROCURONIUM BROMIDE 10 MG/ML (PF) SYRINGE
PREFILLED_SYRINGE | INTRAVENOUS | Status: AC
  Filled 2020-08-12: qty 10

## 2020-08-12 MED ORDER — SUGAMMADEX SODIUM 200 MG/2ML IV SOLN
INTRAVENOUS | Status: DC | PRN
  Administered 2020-08-12: 150 mg via INTRAVENOUS

## 2020-08-12 MED ORDER — ONDANSETRON HCL 4 MG/2ML IJ SOLN: 4.0000 mg | Freq: Four times a day (QID) | INTRAMUSCULAR | Status: DC | PRN

## 2020-08-12 MED ORDER — ACETAMINOPHEN 325 MG PO TABS: 650.0000 mg | ORAL_TABLET | ORAL | Status: DC | PRN

## 2020-08-12 MED ORDER — ACETAMINOPHEN 650 MG RE SUPP: 650.0000 mg | RECTAL | Status: DC | PRN

## 2020-08-12 MED ORDER — CEFAZOLIN SODIUM-DEXTROSE 2-4 GM/100ML-% IV SOLN
2.0000 g | INTRAVENOUS | Status: AC
  Administered 2020-08-12: 2 g via INTRAVENOUS
  Filled 2020-08-12: qty 100

## 2020-08-12 MED ORDER — METHOCARBAMOL 1000 MG/10ML IJ SOLN
500.0000 mg | Freq: Four times a day (QID) | INTRAVENOUS | Status: DC | PRN
  Filled 2020-08-12: qty 5

## 2020-08-12 MED ORDER — KETAMINE HCL 50 MG/5ML IJ SOSY
PREFILLED_SYRINGE | INTRAMUSCULAR | Status: AC
  Filled 2020-08-12: qty 5

## 2020-08-12 MED ORDER — PROPOFOL 10 MG/ML IV BOLUS
INTRAVENOUS | Status: AC
  Filled 2020-08-12: qty 20

## 2020-08-12 MED ORDER — THROMBIN 20000 UNITS EX KIT
PACK | CUTANEOUS | Status: AC
  Filled 2020-08-12: qty 1

## 2020-08-12 MED ORDER — CHLORHEXIDINE GLUCONATE CLOTH 2 % EX PADS: 6.0000 | MEDICATED_PAD | Freq: Once | CUTANEOUS | Status: DC

## 2020-08-12 MED ORDER — LACTATED RINGERS IV SOLN: INTRAVENOUS | Status: DC

## 2020-08-12 MED ORDER — LIDOCAINE-EPINEPHRINE 1 %-1:100000 IJ SOLN
INTRAMUSCULAR | Status: AC
  Filled 2020-08-12: qty 1

## 2020-08-12 MED ORDER — HYDROCHLOROTHIAZIDE 10 MG/ML ORAL SUSPENSION
6.2500 mg | Freq: Every day | ORAL | Status: DC
  Filled 2020-08-12 (×2): qty 1.25

## 2020-08-12 MED ORDER — OXYCODONE-ACETAMINOPHEN 5-325 MG PO TABS
1.0000 | ORAL_TABLET | ORAL | Status: DC | PRN
  Administered 2020-08-12: 2 via ORAL
  Administered 2020-08-13: 1 via ORAL
  Administered 2020-08-13: 2 via ORAL
  Filled 2020-08-12: qty 2
  Filled 2020-08-12: qty 1
  Filled 2020-08-12: qty 2

## 2020-08-12 MED ORDER — KETAMINE HCL 10 MG/ML IJ SOLN
INTRAMUSCULAR | Status: DC | PRN
  Administered 2020-08-12 (×2): 20 mg via INTRAVENOUS

## 2020-08-12 MED ORDER — EPHEDRINE 5 MG/ML INJ
INTRAVENOUS | Status: AC
  Filled 2020-08-12: qty 10

## 2020-08-12 MED ORDER — ONDANSETRON HCL 4 MG/2ML IJ SOLN
INTRAMUSCULAR | Status: DC | PRN
  Administered 2020-08-12: 4 mg via INTRAVENOUS

## 2020-08-12 MED ORDER — LISINOPRIL 10 MG PO TABS: 10.0000 mg | ORAL_TABLET | Freq: Every day | ORAL | Status: DC

## 2020-08-12 MED ORDER — CHLORHEXIDINE GLUCONATE 0.12 % MT SOLN
OROMUCOSAL | Status: AC
  Filled 2020-08-12: qty 15

## 2020-08-12 MED ORDER — ACETAMINOPHEN 160 MG/5ML PO SOLN: 1000.0000 mg | Freq: Once | ORAL | Status: DC | PRN

## 2020-08-12 MED ORDER — THROMBIN 20000 UNITS EX SOLR
CUTANEOUS | Status: DC | PRN
  Administered 2020-08-12: 20 mL via TOPICAL

## 2020-08-12 MED ORDER — PROPOFOL 10 MG/ML IV BOLUS
INTRAVENOUS | Status: DC | PRN
  Administered 2020-08-12: 80 mg via INTRAVENOUS
  Administered 2020-08-12: 30 mg via INTRAVENOUS

## 2020-08-12 MED ORDER — CARBOXYMETHYLCELLUL-GLYCERIN 0.5-0.9 % OP SOLN
1.0000 [drp] | Freq: Every day | OPHTHALMIC | Status: DC | PRN
  Filled 2020-08-12: qty 15

## 2020-08-12 MED ORDER — BUPIVACAINE HCL (PF) 0.5 % IJ SOLN
INTRAMUSCULAR | Status: DC | PRN
  Administered 2020-08-12: 5 mL
  Administered 2020-08-12: 10 mL

## 2020-08-12 MED ORDER — ORAL CARE MOUTH RINSE: 15.0000 mL | Freq: Once | OROMUCOSAL | Status: AC

## 2020-08-12 MED ORDER — SODIUM CHLORIDE 0.9 % IV SOLN
INTRAVENOUS | Status: DC | PRN
  Administered 2020-08-12: 500 mL

## 2020-08-12 MED ORDER — THROMBIN 5000 UNITS EX SOLR
OROMUCOSAL | Status: DC | PRN
  Administered 2020-08-12: 5 mL via TOPICAL

## 2020-08-12 MED ORDER — ACETAMINOPHEN 500 MG PO TABS: 1000.0000 mg | ORAL_TABLET | Freq: Once | ORAL | Status: DC | PRN

## 2020-08-12 MED ORDER — CHLORHEXIDINE GLUCONATE 0.12 % MT SOLN
15.0000 mL | Freq: Once | OROMUCOSAL | Status: AC
  Administered 2020-08-12: 15 mL via OROMUCOSAL

## 2020-08-12 MED ORDER — PHENYLEPHRINE HCL-NACL 10-0.9 MG/250ML-% IV SOLN
INTRAVENOUS | Status: DC | PRN
  Administered 2020-08-12: 20 ug/min via INTRAVENOUS

## 2020-08-12 MED ORDER — METHOCARBAMOL 500 MG PO TABS
500.0000 mg | ORAL_TABLET | Freq: Four times a day (QID) | ORAL | Status: DC | PRN
  Administered 2020-08-12 – 2020-08-13 (×3): 500 mg via ORAL
  Filled 2020-08-12 (×3): qty 1

## 2020-08-12 MED ORDER — MENTHOL 3 MG MT LOZG: 1.0000 | LOZENGE | OROMUCOSAL | Status: DC | PRN

## 2020-08-12 MED ORDER — ACETAMINOPHEN 10 MG/ML IV SOLN
INTRAVENOUS | Status: AC
  Filled 2020-08-12: qty 100

## 2020-08-12 MED ORDER — DILTIAZEM HCL ER COATED BEADS 180 MG PO CP24
360.0000 mg | ORAL_CAPSULE | Freq: Every day | ORAL | Status: DC
  Filled 2020-08-12: qty 2

## 2020-08-12 MED ORDER — LIDOCAINE 2% (20 MG/ML) 5 ML SYRINGE
INTRAMUSCULAR | Status: DC | PRN
  Administered 2020-08-12: 60 mg via INTRAVENOUS

## 2020-08-12 MED ORDER — FENTANYL CITRATE (PF) 250 MCG/5ML IJ SOLN
INTRAMUSCULAR | Status: AC
  Filled 2020-08-12: qty 5

## 2020-08-12 MED ORDER — ROCURONIUM BROMIDE 100 MG/10ML IV SOLN
INTRAVENOUS | Status: DC | PRN
  Administered 2020-08-12: 10 mg via INTRAVENOUS
  Administered 2020-08-12: 20 mg via INTRAVENOUS
  Administered 2020-08-12: 10 mg via INTRAVENOUS
  Administered 2020-08-12: 60 mg via INTRAVENOUS
  Administered 2020-08-12: 10 mg via INTRAVENOUS

## 2020-08-12 MED ORDER — ONDANSETRON HCL 4 MG PO TABS: 4.0000 mg | ORAL_TABLET | Freq: Four times a day (QID) | ORAL | Status: DC | PRN

## 2020-08-12 MED ORDER — FENTANYL CITRATE (PF) 250 MCG/5ML IJ SOLN
INTRAMUSCULAR | Status: DC | PRN
  Administered 2020-08-12 (×5): 50 ug via INTRAVENOUS

## 2020-08-12 MED ORDER — ACETAMINOPHEN 10 MG/ML IV SOLN: 1000.0000 mg | Freq: Once | INTRAVENOUS | Status: DC | PRN

## 2020-08-12 MED ORDER — BUPIVACAINE HCL (PF) 0.5 % IJ SOLN
INTRAMUSCULAR | Status: AC
  Filled 2020-08-12: qty 30

## 2020-08-12 MED ORDER — DEXAMETHASONE SODIUM PHOSPHATE 10 MG/ML IJ SOLN
INTRAMUSCULAR | Status: DC | PRN
  Administered 2020-08-12: 10 mg via INTRAVENOUS

## 2020-08-12 MED ORDER — LIDOCAINE-EPINEPHRINE 1 %-1:100000 IJ SOLN
INTRAMUSCULAR | Status: DC | PRN
  Administered 2020-08-12: 5 mL

## 2020-08-12 MED ORDER — 0.9 % SODIUM CHLORIDE (POUR BTL) OPTIME
TOPICAL | Status: DC | PRN
  Administered 2020-08-12: 1000 mL

## 2020-08-12 MED ORDER — THROMBIN 20000 UNITS EX SOLR
CUTANEOUS | Status: AC
  Filled 2020-08-12: qty 20000

## 2020-08-12 SURGICAL SUPPLY — 67 items
BAG DECANTER FOR FLEXI CONT (MISCELLANEOUS) ×2 IMPLANT
BASKET BONE COLLECTION (BASKET) ×2 IMPLANT
BLADE CLIPPER SURG (BLADE) IMPLANT
BONE MATRIX OSTEOCEL PRO LRG (Bone Implant) ×2 IMPLANT
BUR MATCHSTICK NEURO 3.0 LAGG (BURR) ×2 IMPLANT
CAGE PLIF 8X9X23-12 LUMBAR (Cage) ×2 IMPLANT
CANISTER SUCT 3000ML PPV (MISCELLANEOUS) ×2 IMPLANT
CNTNR URN SCR LID CUP LEK RST (MISCELLANEOUS) ×1 IMPLANT
CONT SPEC 4OZ STRL OR WHT (MISCELLANEOUS) ×2
COVER BACK TABLE 60X90IN (DRAPES) ×2 IMPLANT
COVER WAND RF STERILE (DRAPES) ×2 IMPLANT
DECANTER SPIKE VIAL GLASS SM (MISCELLANEOUS) ×2 IMPLANT
DERMABOND ADVANCED (GAUZE/BANDAGES/DRESSINGS) ×1
DERMABOND ADVANCED .7 DNX12 (GAUZE/BANDAGES/DRESSINGS) ×1 IMPLANT
DEVICE DISSECT PLASMABLAD 3.0S (MISCELLANEOUS) ×1 IMPLANT
DRAPE C-ARM 42X72 X-RAY (DRAPES) ×4 IMPLANT
DRAPE HALF SHEET 40X57 (DRAPES) IMPLANT
DRAPE LAPAROTOMY 100X72X124 (DRAPES) ×2 IMPLANT
DURAPREP 26ML APPLICATOR (WOUND CARE) ×2 IMPLANT
DURASEAL APPLICATOR TIP (TIP) IMPLANT
DURASEAL SPINE SEALANT 3ML (MISCELLANEOUS) IMPLANT
ELECT REM PT RETURN 9FT ADLT (ELECTROSURGICAL) ×2
ELECTRODE REM PT RTRN 9FT ADLT (ELECTROSURGICAL) ×1 IMPLANT
GAUZE 4X4 16PLY RFD (DISPOSABLE) IMPLANT
GAUZE SPONGE 4X4 12PLY STRL (GAUZE/BANDAGES/DRESSINGS) ×2 IMPLANT
GLOVE BIO SURGEON STRL SZ 6.5 (GLOVE) ×4 IMPLANT
GLOVE BIOGEL PI IND STRL 6.5 (GLOVE) ×1 IMPLANT
GLOVE BIOGEL PI IND STRL 7.5 (GLOVE) ×2 IMPLANT
GLOVE BIOGEL PI IND STRL 8.5 (GLOVE) ×2 IMPLANT
GLOVE BIOGEL PI INDICATOR 6.5 (GLOVE) ×1
GLOVE BIOGEL PI INDICATOR 7.5 (GLOVE) ×2
GLOVE BIOGEL PI INDICATOR 8.5 (GLOVE) ×2
GLOVE ECLIPSE 7.5 STRL STRAW (GLOVE) ×2 IMPLANT
GLOVE ECLIPSE 8.5 STRL (GLOVE) ×4 IMPLANT
GLOVE SURG SS PI 8.0 STRL IVOR (GLOVE) ×2 IMPLANT
GOWN STRL REUS W/ TWL LRG LVL3 (GOWN DISPOSABLE) ×2 IMPLANT
GOWN STRL REUS W/ TWL XL LVL3 (GOWN DISPOSABLE) ×4 IMPLANT
GOWN STRL REUS W/TWL 2XL LVL3 (GOWN DISPOSABLE) ×4 IMPLANT
GOWN STRL REUS W/TWL LRG LVL3 (GOWN DISPOSABLE) ×4
GOWN STRL REUS W/TWL XL LVL3 (GOWN DISPOSABLE) ×8
HEMOSTAT POWDER KIT SURGIFOAM (HEMOSTASIS) ×2 IMPLANT
KIT BASIN OR (CUSTOM PROCEDURE TRAY) ×2 IMPLANT
KIT TURNOVER KIT B (KITS) ×2 IMPLANT
MILL MEDIUM DISP (BLADE) ×2 IMPLANT
NEEDLE HYPO 22GX1.5 SAFETY (NEEDLE) ×2 IMPLANT
NS IRRIG 1000ML POUR BTL (IV SOLUTION) ×2 IMPLANT
PACK LAMINECTOMY NEURO (CUSTOM PROCEDURE TRAY) ×2 IMPLANT
PAD ARMBOARD 7.5X6 YLW CONV (MISCELLANEOUS) IMPLANT
PATTIES SURGICAL .5 X1 (DISPOSABLE) ×4 IMPLANT
PLASMABLADE 3.0S (MISCELLANEOUS) ×2
ROD RELINE-O LORD 5.5X40 (Rod) ×4 IMPLANT
SCREW LOCK RELINE 5.5 TULIP (Screw) ×8 IMPLANT
SCREW RELINE-O POLY 6.5X45 (Screw) ×4 IMPLANT
SCREW RELINE-O POLY 6.5X50MM (Screw) ×4 IMPLANT
SPONGE LAP 4X18 RFD (DISPOSABLE) IMPLANT
SPONGE SURGIFOAM ABS GEL 100 (HEMOSTASIS) ×2 IMPLANT
SUT PROLENE 6 0 BV (SUTURE) IMPLANT
SUT VIC AB 1 CT1 18XBRD ANBCTR (SUTURE) ×1 IMPLANT
SUT VIC AB 1 CT1 8-18 (SUTURE) ×1
SUT VIC AB 2-0 CP2 18 (SUTURE) ×2 IMPLANT
SUT VIC AB 3-0 SH 8-18 (SUTURE) ×2 IMPLANT
SUT VIC AB 4-0 RB1 18 (SUTURE) ×2 IMPLANT
SYR 3ML LL SCALE MARK (SYRINGE) ×8 IMPLANT
TOWEL GREEN STERILE (TOWEL DISPOSABLE) ×2 IMPLANT
TOWEL GREEN STERILE FF (TOWEL DISPOSABLE) ×2 IMPLANT
TRAY FOLEY MTR SLVR 16FR STAT (SET/KITS/TRAYS/PACK) ×2 IMPLANT
WATER STERILE IRR 1000ML POUR (IV SOLUTION) ×2 IMPLANT

## 2020-08-12 NOTE — Transfer of Care (Signed)
Immediate Anesthesia Transfer of Care Note  Patient: Miguel Bennett  Procedure(s) Performed: Lumbar five Sacral one Posterior lumbar interbody fusion (N/A Back)  Patient Location: PACU  Anesthesia Type:General  Level of Consciousness: drowsy  Airway & Oxygen Therapy: Patient Spontanous Breathing and Patient connected to face mask oxygen  Post-op Assessment: Report given to RN and Post -op Vital signs reviewed and stable. Pt moving extremities x4.  Post vital signs: Reviewed  Last Vitals:  Vitals Value Taken Time  BP 129/98 08/12/20 1207  Temp    Pulse 66 08/12/20 1212  Resp 20 08/12/20 1212  SpO2 100 % 08/12/20 1212  Vitals shown include unvalidated device data.  Last Pain:  Vitals:   08/12/20 0614  PainSc: 2          Complications: No complications documented.

## 2020-08-12 NOTE — H&P (Signed)
Miguel Bennett is an 69 y.o. male.   Chief Complaint: Spondylolisthesis L5-S1, back and right leg pain HPI: Miguel Bennett is a 69 year old individual whom I had seen over a number of years.  He is had back and right lower extremity pain that has been treated conservatively this is secondary to a spondylolisthesis at L5-S1.  He has tried all manner of conservative treatment including physical therapy extensive use of injections and conservative management.  Little over a year ago he was diagnosed with a throat cancer and plan surgery was put on hold.  Patient notes that he is now in a place where he would like to have the surgery done to decompress and stabilize L5-S1 and he is admitted for that surgery.  He has been experiencing some weakness in that right leg that he feels is been progressively worsening.  Past Medical History:  Diagnosis Date  . Bleeding ulcer 2017   resolved due to surgery  . 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 GERD (gastroesophageal reflux disease)    occasionally  . History of kidney stones   . Hypertension   . Neck tightness    due to previous  neck surgery due to tonsilar cancer    Past Surgical History:  Procedure Laterality Date  . CATARACT EXTRACTION W/ INTRAOCULAR LENS  IMPLANT, BILATERAL    . CHOLECYSTECTOMY    . COLONOSCOPY    . DIRECT LARYNGOSCOPY Right 03/18/2019   Procedure: DIRECT LARYNGOSCOPY WITH BIOPSY OF RIGHT TONSILLAR MASS;  Surgeon: [name], MD;  Location: [facility];  Service: ENT;  Laterality: Right;  . NECK SURGERY     at [facility] to get cancer out of neck area from tonsils  . POLYPECTOMY     colon  . STOMACH SURGERY  1984   40 % of stomach removed due to a bleeding ulcer  . TONSILLECTOMY      History reviewed. No pertinent family history. Social History:  reports that he quit smoking about 40 years ago. His smoking use included cigarettes. He has a 12.00 pack-year smoking history. He has never used smokeless tobacco. He reports previous  alcohol use of about 20.0 standard drinks of alcohol per week. He reports that he does not use drugs.  Allergies:  Allergies  Allergen Reactions  . Flomax [Tamsulosin]     Combined with keflex caused pt to passed out  . Keflex [Cephalexin]     In combination with flomax caused pt to pass out    Medications Prior to Admission  Medication Sig Dispense Refill  . calcium carbonate (TUMS - DOSED IN MG ELEMENTAL CALCIUM) 500 MG chewable tablet Chew 1 tablet by mouth daily as needed for indigestion or heartburn.    . Carboxymethylcellul-Glycerin (LUBRICATING EYE DROPS OP) Place 1 drop into both eyes daily as needed (dry eyes).    . Cholecalciferol (VITAMIN D) 50 MCG (2000 UT) tablet Take 2,000 Units by mouth daily.     . Cyanocobalamin (VITAMIN B-12 PO) Take 1 tablet by mouth daily.     . diclofenac Sodium (VOLTAREN) 1 % GEL Apply 2 g topically 2 (two) times daily as needed (pain). Applied to neck area     . diltiazem (DILACOR XR) 180 MG 24 hr capsule Take 360 mg by mouth daily.     . famotidine (PEPCID) 20 MG tablet Take 20 mg by mouth daily as needed for heartburn.     . ferrous sulfate 325 (65 FE) MG tablet Take 325 mg  by mouth daily with breakfast.    . HYDROcodone-acetaminophen (NORCO/VICODIN) 5-325 MG tablet Take 1 tablet by mouth every 6 (six) hours as needed for pain.    . Multiple Vitamin (MULTIVITAMIN WITH MINERALS) TABS tablet Take 1 tablet by mouth daily.    . ondansetron (ZOFRAN-ODT) 4 MG disintegrating tablet Take 4 mg by mouth every 8 (eight) hours as needed for nausea/vomiting.    . quinapril-hydrochlorothiazide (ACCURETIC) 20-12.5 MG tablet Take 0.5 tablets by mouth daily.     . cephALEXin (KEFLEX) 500 MG capsule Take 1 capsule (500 mg total) by mouth 3 (three) times daily. (Patient not taking: Reported on 08/02/2020) 30 capsule 0  . ondansetron (ZOFRAN) 4 MG tablet Take 1 tablet (4 mg total) by mouth every 8 (eight) hours as needed for nausea or vomiting. (Patient not taking:  Reported on 08/02/2020) 6 tablet 0  . tamsulosin (FLOMAX) 0.4 MG CAPS capsule Take 1 po QD until you pass the stone. (Patient not taking: Reported on 08/02/2020) 10 capsule 0    Results for orders placed or performed during the hospital encounter of 08/12/20 (from the past 48 hour(s))  ABO/Rh     Status: None (Preliminary result)   Collection Time: 08/12/20  6:54 AM  Result Value Ref Range   ABO/RH(D) PENDING    No results found.  Review of Systems  Constitutional: Positive for activity change.  HENT:       History of throat cancer treated surgically and with radiation and chemo  Eyes: Negative.   Respiratory: Negative.   Cardiovascular: Negative.   Gastrointestinal: Negative.   Endocrine: Negative.   Genitourinary: Negative.   Musculoskeletal: Positive for back pain and myalgias.  Allergic/Immunologic: Negative.   Neurological: Positive for weakness and numbness.  Hematological: Negative.   Psychiatric/Behavioral: Negative.     Blood pressure 135/85, pulse (!) 51, height 6' (1.829 m), weight 60.8 kg, SpO2 100 %. Physical Exam Constitutional:      Appearance: Normal appearance. He is normal weight.  HENT:     Head: Normocephalic and atraumatic.     Right Ear: Tympanic membrane normal.     Left Ear: Tympanic membrane normal.     Nose: Nose normal.     Mouth/Throat:     Mouth: Mucous membranes are dry.  Eyes:     Extraocular Movements: Extraocular movements intact.     Pupils: Pupils are equal, round, and reactive to light.  Cardiovascular:     Rate and Rhythm: Normal rate and regular rhythm.     Pulses: Normal pulses.     Heart sounds: Normal heart sounds.  Pulmonary:     Effort: Pulmonary effort is normal.     Breath sounds: Normal breath sounds.  Abdominal:     General: Abdomen is flat.     Palpations: Abdomen is soft.  Musculoskeletal:        General: Normal range of motion.     Cervical back: Normal range of motion and neck supple.  Skin:    General: Skin is  warm and dry.     Capillary Refill: Capillary refill takes less than 2 seconds.  Neurological:     Mental Status: He is alert.     Comments: Cranial nerve examination is within limits of normal.  Upper extremity strength is normal.  Straight leg raising is positive on the right side at 30 degrees Patrick's maneuver is negative bilaterally straight leg raising is negative to 80 degrees on the left side palpation percussion of the back did  not reproduce any overt tenderness.  Mild weakness in the gastroc on the right compared to the left absent Achilles reflex on the left and the right 1+ patellar reflex on both sides.  Proximal strength in iliopsoas quadriceps abductors and abductors appears intact otherwise.  Psychiatric:        Mood and Affect: Mood normal.        Behavior: Behavior normal.        Thought Content: Thought content normal.        Judgment: Judgment normal.      Assessment/Plan Spondylolisthesis L5-S1, right lumbar radiculopathy. Plan: Decompression fusion L5-S1 with posterior lumbar interbody technique.  Earleen Newport, MD 08/12/2020, 7:34 AM

## 2020-08-12 NOTE — Op Note (Signed)
Date of surgery: 08/12/2020 Preoperative diagnosis: Spondylolisthesis grade 2 L5-S1 with right lumbar radiculopathy.  Lumbar scoliosis. Postoperative diagnosis: Same Procedure: L5 laminectomy and decompression of the L5 and the S1 nerve roots with more work than required for simple interbody technique.  Posterior lumbar interbody arthrodesis using peek spacers local autograft allograft.  Pedicle screw fixation L5-S1 with posterior lateral arthrodesis using local autograft and allograft L5-S1. Surgeon: Kristeen Miss First Assistant: Duffy Rhody, MD Second assistant: Osie Cheeks NP Anesthesia: General endotracheal Indications: Miguel Bennett is a 69 year old individual who has had a spondylolisthesis for a number of years he also has degenerative scoliosis and I have treated him conservatively for a back pain and a right lumbar radiculopathy related to severe stenosis at the L5-S1 level.  He has been advised regarding surgical intervention and is now taken to the operating room to undergo surgical decompression and stabilization at L5-S1.  Procedure: Patient was brought to the operating room supine on the stretcher.  After the smooth induction of general endotracheal anesthesia Foley catheter was placed and patient was carefully turned in the prone position.  The back was prepped with alcohol DuraPrep and draped in a sterile fashion.  Midline incision was created and carried down to the lumbodorsal fascia the fascia was opened on either side midline to expose the spinous processes of L5 and the sacrum and the bottom of L4.  Verification of the positioning was obtained with a singular lateral radiograph.  The L5 lamina was noted to be loose secondary to the spondylolisthesis and was dissected on the left and the right side.  The process had become rather dysmorphic with hypertrophied facets worse on the left than on the right.  Gradually these facets were mobilized but they were ultimately removed in a piecemeal  fashion removing completely the articular process of the facet at L5-S1.  The superior portions were noted to be loose secondary to the pars defects.  These areas were carefully decompressed and the path of the common dural tube and the L5 nerve root was isolated and protected carefully as the L5 nerve root was decompressed out into the lateral recess.  This was particularly tedious on the right side where the patient had had substantial radiculopathy.  The dissection was carried out into the extraforaminal zone.  The disc space was then isolated on both sides.  The dura was noted to be very bowed and bulging but it was protected all the while with no CSF leaks encountered.  Once the L5 nerve roots in the S1 nerve roots were decompressed and that the space was isolated the disc space was entered first on the left side using a #15 blade and a combination of pituitary rongeurs and curettes the disc was removed from within the disc space.  The interspace was noted to be rather tight and was initially opened on the left side with a small surgical dynamics osteotome.  This allowed placement of a 6 mm spacer.  The right side was then opened and a discectomy was performed and ultimately by working from side to side we were able to distract the interspace so that we could then place a peek spacer measuring 8 x 9 x 23 mm in size with 12 degrees of lordosis.  The spacers were packed with a combination of autograft and allograft in the form of ostia cell.  Once the endplates were completely decorticated and adequate total discectomy was performed from the left to right at L5-S1 the interspace was packed with  a total of 12 cc of autograft allograft mixture along with the 2 cages.  Then the lateral recesses were decompressed and the intertransverse space from L5 to the sacral ala was also exposed the sacral ala was decorticated by creating a small window into the cancellous bone and the transverse process of L5 was decorticated.   A total of 6 cc of bone graft was packed into the intertransverse space on the right and on the left.  Pedicle entry sites were then chosen at L5 and S1 using fluoroscopic guidance a 6.5 x 45 mm screws were placed into L5 6.5 x 50 mm screws were placed into the sacrum.  Short 40 mm 5.5 rods were used to connect L5 and S1 the rods were precontoured.  The construct was connected in a neutral fashion.  Once the system was torqued down the area was checked for hemostasis final radiographs were obtained in AP and lateral projection and then a careful closure was performed with #1 Vicryl in interrupted fashion and the lumbodorsal fascia 2-0 Vicryl in the subcutaneous tissues 3-0 Vicryl subcuticularly and the final subcuticular layer of closure with 4-0 Vicryl blood loss was estimated 200 cc no CSF leaks were encountered patient tolerated procedure well is returned to recovery room in stable condition.

## 2020-08-12 NOTE — Progress Notes (Signed)
Patient confirmed no blood transfusions since PAT appointment with pre-op nurse.  MRN number and patient name verified on both blood bank armband and patient arm band.

## 2020-08-12 NOTE — Anesthesia Procedure Notes (Addendum)
Procedure Name: Intubation Date/Time: 08/12/2020 7:54 AM Performed by: Janene Harvey, CRNA Pre-anesthesia Checklist: Patient identified, Emergency Drugs available, Suction available and Patient being monitored Patient Re-evaluated:Patient Re-evaluated prior to induction Oxygen Delivery Method: Circle system utilized Preoxygenation: Pre-oxygenation with 100% oxygen Induction Type: IV induction Ventilation: Mask ventilation without difficulty Laryngoscope Size: Glidescope and 3 Grade View: Grade I Tube type: Oral Tube size: 7.0 mm Number of attempts: 1 Airway Equipment and Method: Stylet and Oral airway Placement Confirmation: ETT inserted through vocal cords under direct vision,  positive ETCO2 and breath sounds checked- equal and bilateral Secured at: 22 cm Tube secured with: Tape Dental Injury: Teeth and Oropharynx as per pre-operative assessment  Comments: Elective glidescope. Pt hx of tonsil CA, self report of neck tightness/limited neck ROM

## 2020-08-12 NOTE — Progress Notes (Signed)
Orthopedic Tech Progress Note Patient Details:  Miguel Bennett 12/31/1950 [id number] Ordered pt brace Patient ID: [name], male   DOB: 1951/03/26, 69 y.o.   MRN: [id number]   Tammy Sours 08/12/2020, 4:09 PM

## 2020-08-12 NOTE — Anesthesia Preprocedure Evaluation (Signed)
Anesthesia Evaluation  Patient identified by MRN, date of birth, ID band Patient awake    Reviewed: Allergy & Precautions, NPO status , Patient's Chart, lab work & pertinent test results  History of Anesthesia Complications Negative for: history of anesthetic complications  Airway Mallampati: IV  TM Distance: <3 FB Neck ROM: Limited  Mouth opening: Limited Mouth Opening  Dental  (+) Dental Advisory Given, Teeth Intact   Pulmonary neg pulmonary ROS, former smoker,  Covid-19 Nucleic Acid Test Results Lab Results      Component                Value               Date                      [id number]              NEGATIVE            08/09/2020              breath sounds clear to auscultation       Cardiovascular hypertension, Pt. on medications (-) angina(-) Past MI and (-) CHF  Rhythm:Regular     Neuro/Psych negative neurological ROS  negative psych ROS   GI/Hepatic Neg liver ROS, PUD, GERD  Medicated and Controlled,  Endo/Other  negative endocrine ROS  Renal/GU negative Renal ROS     Musculoskeletal negative musculoskeletal ROS (+)   Abdominal   Peds  Hematology negative hematology ROS (+)   Anesthesia Other Findings   Reproductive/Obstetrics                             Anesthesia Physical Anesthesia Plan  ASA: II  Anesthesia Plan: General   Post-op Pain Management:    Induction: Intravenous  PONV Risk Score and Plan: 2 and Ondansetron and Dexamethasone  Airway Management Planned: Oral ETT and Video Laryngoscope Planned  Additional Equipment: None  Intra-op Plan:   Post-operative Plan: Extubation in OR  Informed Consent: I have reviewed the patients History and Physical, chart, labs and discussed the procedure including the risks, benefits and alternatives for the proposed anesthesia with the patient or authorized representative who has indicated his/her understanding and  acceptance.     Dental advisory given  Plan Discussed with: CRNA and Surgeon  Anesthesia Plan Comments:         Anesthesia Quick Evaluation

## 2020-08-12 NOTE — Evaluation (Addendum)
I agree with the following treatment note after review of the documentation. This session was performed under the supervision of a licensed clinician.   [name], DPT  Acute Rehabilitation Services  Pager: [phone]  Physical Therapy Evaluation & Discharge Patient Details Name: Miguel Bennett MRN: [id number] DOB: 1951-07-09 Today's Date: 08/12/2020   History of Present Illness  pt is a 69 yo male who is s/p PLIF of L5-S1. Pt has a PMH of Cancer, HTN, and GERD.   Clinical Impression  Pt was evaluated and assessed for the above diagnosis and impairments below. Pt required supervision to min guard assist with gait and stair navigation. Required min A to ensure log roll technique with bed mobility. Educated about generalized walking program and back precautions. He lives at home with his wife who will be providing assist 24/7. No recommendations for PT at d/c.  All education and functional tasks completed. Will sign off from acute PT therapies. If needs change, please reconsult.     Follow Up Recommendations No PT follow up    Equipment Recommendations  None recommended by PT    Recommendations for Other Services       Precautions / Restrictions Precautions Precautions: Back Precaution Booklet Issued: Yes (comment) Precaution Comments: pt edcuated on spinal precautions Required Braces or Orthoses: Spinal Brace Spinal Brace: Lumbar corset Restrictions Weight Bearing Restrictions: No      Mobility  Bed Mobility Overal bed mobility: Needs Assistance Bed Mobility: Rolling;Sidelying to Sit;Sit to Sidelying Rolling: Min assist Sidelying to sit: Min assist     Sit to sidelying: Min assist General bed mobility comments: pt required max multimodal cueing for bed mobility using log roll technique. Pt required min assist at LE mainly to maintain precautions. Pt practiced rolling and sidelying to sit a few times to ensure proper technique.   Transfers Overall transfer level: Needs  assistance Equipment used: None Transfers: Sit to/from Stand Sit to Stand: Supervision         General transfer comment: supervision level assist required for safety  Ambulation/Gait Ambulation/Gait assistance: Min guard Gait Distance (Feet): 200 Feet Assistive device: None Gait Pattern/deviations: Step-through pattern;Decreased step length - right;Decreased step length - left;Decreased stride length;Decreased dorsiflexion - right;Narrow base of support Gait velocity: decrease   General Gait Details: pt was noted to be slow and steady with gait. Pt required wall railing intermittently and min guard assist for safety. As session progressed, gait stride length improved   Stairs Stairs: Yes Stairs assistance: Min guard Stair Management: One rail Right;Forwards;Alternating pattern Number of Stairs: 4 General stair comments: required min guard assist with stair navigation with R railings. No physical assist required.  Wheelchair Mobility    Modified Rankin (Stroke Patients Only)       Balance Overall balance assessment: No apparent balance deficits (not formally assessed)              Pertinent Vitals/Pain Pain Assessment: 0-10 Pain Score: 2  Pain Location: low back Pain Descriptors / Indicators: Sore;Operative site guarding;Guarding Pain Intervention(s): Limited activity within patient's tolerance;Monitored during session;Repositioned    Home Living Family/patient expects to be discharged to:: Private residence Living Arrangements: Spouse/significant other Available Help at Discharge: Family;Available 24 hours/day Type of Home: House Home Access: Stairs to enter Entrance Stairs-Rails: Psychiatric nurse of Steps: 4 Home Layout: Able to live on main level with bedroom/bathroom;Two level Home Equipment: Walker - 2 wheels;Shower seat - built in;Bedside commode;Toilet riser;Hand held Tourist information centre manager - 4 wheels  Prior Function Level of  Independence: Independent               Hand Dominance        Extremity/Trunk Assessment   Upper Extremity Assessment Upper Extremity Assessment: Defer to OT evaluation    Lower Extremity Assessment Lower Extremity Assessment: Overall WFL for tasks assessed;RLE deficits/detail RLE Deficits / Details: Reports RLE symptoms improved.     Cervical / Trunk Assessment Cervical / Trunk Assessment: Normal  Communication   Communication: No difficulties  Cognition Arousal/Alertness: Awake/alert Behavior During Therapy: WFL for tasks assessed/performed Overall Cognitive Status: Within Functional Limits for tasks assessed         General Comments      Exercises     Assessment/Plan    PT Assessment Patent does not need any further PT services  PT Problem List         PT Treatment Interventions      PT Goals (Current goals can be found in the Care Plan section)  Acute Rehab PT Goals Patient Stated Goal: be pain free in R leg PT Goal Formulation: With patient/family Time For Goal Achievement: 08/12/20 Potential to Achieve Goals: Good    Frequency     Barriers to discharge        Co-evaluation               AM-PAC PT "6 Clicks" Mobility  Outcome Measure Help needed turning from your back to your side while in a flat bed without using bedrails?: A Little Help needed moving from lying on your back to sitting on the side of a flat bed without using bedrails?: A Little Help needed moving to and from a bed to a chair (including a wheelchair)?: None Help needed standing up from a chair using your arms (e.g., wheelchair or bedside chair)?: None Help needed to walk in hospital room?: A Little Help needed climbing 3-5 steps with a railing? : A Little 6 Click Score: 20    End of Session Equipment Utilized During Treatment: Gait belt Activity Tolerance: Patient tolerated treatment well Patient left: in bed;with call bell/phone within reach;with family/visitor  present;with nursing/sitter in room Nurse Communication: Mobility status PT Visit Diagnosis: Pain;Other abnormalities of gait and mobility (R26.89) Pain - part of body:  (low back)    Time: 9242-6834 PT Time Calculation (min) (ACUTE ONLY): 36 min   Charges:   PT Evaluation $PT Eval Low Complexity: 1 Low PT Treatments $Gait Training: 8-22 mins       Gloriann Loan, SPT  Acute Rehabilitation Services  Office: [phone]  08/12/2020, 6:10 PM

## 2020-08-13 LAB — CBC
HCT: 31.7 % — ABNORMAL LOW (ref 39.0–52.0)
Hemoglobin: 10.1 g/dL — ABNORMAL LOW (ref 13.0–17.0)
MCH: 33 pg (ref 26.0–34.0)
MCHC: 31.9 g/dL (ref 30.0–36.0)
MCV: 103.6 fL — ABNORMAL HIGH (ref 80.0–100.0)
Platelets: 270 10*3/uL (ref 150–400)
RBC: 3.06 MIL/uL — ABNORMAL LOW (ref 4.22–5.81)
RDW: 12.6 % (ref 11.5–15.5)
WBC: 15 10*3/uL — ABNORMAL HIGH (ref 4.0–10.5)
nRBC: 0 % (ref 0.0–0.2)

## 2020-08-13 LAB — BASIC METABOLIC PANEL
Anion gap: 9 (ref 5–15)
BUN: 17 mg/dL (ref 8–23)
CO2: 22 mmol/L (ref 22–32)
Calcium: 8.9 mg/dL (ref 8.9–10.3)
Chloride: 102 mmol/L (ref 98–111)
Creatinine, Ser: 1.06 mg/dL (ref 0.61–1.24)
GFR calc Af Amer: >60 mL/min (ref >60)
GFR calc non Af Amer: >60 mL/min (ref >60)
Glucose, Bld: 186 mg/dL — ABNORMAL HIGH (ref 70–99)
Potassium: 4 mmol/L (ref 3.5–5.1)
Sodium: 133 mmol/L — ABNORMAL LOW (ref 135–145)

## 2020-08-13 MED ORDER — OXYCODONE-ACETAMINOPHEN 5-325 MG PO TABS: 1.0000 | ORAL_TABLET | ORAL | 0 refills | Status: AC | PRN

## 2020-08-13 MED ORDER — METHOCARBAMOL 500 MG PO TABS: 500.0000 mg | ORAL_TABLET | Freq: Four times a day (QID) | ORAL | 1 refills | Status: DC | PRN

## 2020-08-13 MED ORDER — DICLOFENAC SODIUM 1 % EX GEL: 2.0000 g | Freq: Two times a day (BID) | CUTANEOUS | 0 refills | Status: AC | PRN

## 2020-08-13 NOTE — Progress Notes (Signed)
Patient alert and oriented, mae's well, voiding adequate amount of urine, swallowing without difficulty, no c/o pain at time of discharge. Patient discharged home with family. Script and discharged instructions given to patient. Patient and family stated understanding of instructions given. Patient has an appointment with Dr. Elsner  

## 2020-08-13 NOTE — Discharge Summary (Signed)
Physician Discharge Summary    Providing Compassionate, Quality Care - Together  Patient ID: Miguel Bennett MRN: [id number] DOB/AGE: [birth year] 69 y.o.  Admit date: 08/12/2020 Discharge date: 08/13/2020  Admission Diagnoses: Spondylolisthesis grade 2 L5-S1 with right lumbar radiculopathy Lumbar scoliosis  Discharge Diagnoses:  Active Problems:   Spondylolisthesis at L5-S1 level Spondylolisthesis grade 2 L5-S1 with right lumbar radiculopathy.Lumbar scoliosis  Discharged Condition: good  Hospital Course: uncomplicated/stable  Consults: None  Significant Diagnostic Studies: n/a  Treatments:  L5 laminectomy and decompression of the L5 and the S1 nerve roots with more work than required for simple interbody technique.  Posterior lumbar interbody arthrodesis using peek spacers local autograft allograft.  Pedicle screw fixation L5-S1 with posterior lateral arthrodesis using local autograft and allograft L5-S1.  Discharge Exam: Blood pressure 105/68, pulse 65, temperature 98.3 F (36.8 C), temperature source Oral, resp. rate 18, height 6' (1.829 m), weight 60.8 kg, SpO2 99 %. Alert and oriented x 4 PERRLA CN II-XII grossly intact MAE, Strength and sensation intact,  RLE plantar flexion/dorsiplexion: 4/5, LLE plantar flexion/dorsiplexion: 5/5. Incision is covered with Dermabond; Dressing is clean, dry, and intact Pt states that his right leg has no pain, denies numbness and paresthesias. Patient is ambulating around the hall with Aspen lumbar brace. Patient is feeling good and ready to be discharged. Pt is able to pass gas, no BM yet. Encouraged patient to take stool softeners at home. Discharge instructions given. Pt verbalized understanding.    Disposition: Discharge disposition: 01-Home or Self Care       Discharge Instructions    Incentive spirometry RT   Complete by: As directed      Allergies as of 08/13/2020      Reactions   Flomax [tamsulosin]    Combined with keflex  caused pt to passed out   Keflex [cephalexin]    In combination with flomax caused pt to pass out      Medication List    STOP taking these medications   cephALEXin 500 MG capsule Commonly known as: KEFLEX   HYDROcodone-acetaminophen 5-325 MG tablet Commonly known as: NORCO/VICODIN   ondansetron 4 MG tablet Commonly known as: ZOFRAN     TAKE these medications   calcium carbonate 500 MG chewable tablet Commonly known as: TUMS - dosed in mg elemental calcium Chew 1 tablet by mouth daily as needed for indigestion or heartburn.   diclofenac Sodium 1 % Gel Commonly known as: Voltaren Apply 2 g topically 2 (two) times daily as needed (pain). Applied to neck area   diltiazem 180 MG 24 hr capsule Commonly known as: DILACOR XR Take 360 mg by mouth daily.   famotidine 20 MG tablet Commonly known as: PEPCID Take 20 mg by mouth daily as needed for heartburn.   ferrous sulfate 325 (65 FE) MG tablet Take 325 mg by mouth daily with breakfast.   LUBRICATING EYE DROPS OP Place 1 drop into both eyes daily as needed (dry eyes).   methocarbamol 500 MG tablet Commonly known as: Robaxin Take 1 tablet (500 mg total) by mouth every 6 (six) hours as needed for muscle spasms.   multivitamin with minerals Tabs tablet Take 1 tablet by mouth daily.   ondansetron 4 MG disintegrating tablet Commonly known as: ZOFRAN-ODT Take 4 mg by mouth every 8 (eight) hours as needed for nausea/vomiting.   oxyCODONE-acetaminophen 5-325 MG tablet Commonly known as: Percocet Take 1 tablet by mouth every 4 (four) hours as needed for severe pain.   quinapril-hydrochlorothiazide 20-12.5 MG  tablet Commonly known as: ACCURETIC Take 0.5 tablets by mouth daily.   tamsulosin 0.4 MG Caps capsule Commonly known as: Flomax Take 1 po QD until you pass the stone.   VITAMIN B-12 PO Take 1 tablet by mouth daily.   Vitamin D 50 MCG (2000 UT) tablet Take 2,000 Units by mouth daily.        Signed: Osie Cheeks, NP 08/13/2020, 8:25 AM

## 2020-08-13 NOTE — Evaluation (Signed)
Occupational Therapy Evaluation Patient Details Name: Miguel Bennett MRN: [id number] DOB: May 22, 1951 Today's Date: 08/13/2020    History of Present Illness pt is a 69 yo male who is s/p PLIF of L5-S1. Pt has a PMH of Cancer, HTN, and GERD.    Clinical Impression   Patient evaluated by Occupational Therapy with no further acute OT needs identified. All education has been completed and the patient has no further questions. All education completed.  Pt demonstrates good understanding of back precautions and is able to perform ADLs at supervision to mod I level.  See below for any follow-up Occupational Therapy or equipment needs. OT is signing off. Thank you for this referral.      Follow Up Recommendations  No OT follow up;Supervision - Intermittent    Equipment Recommendations  None recommended by OT    Recommendations for Other Services       Precautions / Restrictions Precautions Precautions: Back Precaution Booklet Issued: Yes (comment) Precaution Comments: Pt able to verbalize 3/3 precautions  Required Braces or Orthoses: Spinal Brace Spinal Brace: Lumbar corset      Mobility Bed Mobility               General bed mobility comments: Pt ambulating in room   Transfers Overall transfer level: Modified independent                    Balance Overall balance assessment: No apparent balance deficits (not formally assessed)                                         ADL either performed or assessed with clinical judgement   ADL Overall ADL's : Needs assistance/impaired Eating/Feeding: Independent   Grooming: Wash/dry hands;Wash/dry face;Oral care;Brushing hair;Supervision/safety;Standing Grooming Details (indicate cue type and reason): Pt instructed in safe technique  Upper Body Bathing: Set up;Sitting   Lower Body Bathing: Supervison/ safety;Sit to/from stand Lower Body Bathing Details (indicate cue type and reason): able to perform figure  4 Upper Body Dressing : Set up   Lower Body Dressing: Supervision/safety;Sit to/from stand Lower Body Dressing Details (indicate cue type and reason): able to perform figure 4 Toilet Transfer: Supervision/safety;Ambulation;Comfort height toilet   Toileting- Clothing Manipulation and Hygiene: Supervision/safety;Sit to/from stand   Tub/ Shower Transfer: Walk-in shower;Supervision/safety;Ambulation;Shower seat   Functional mobility during ADLs: Modified independent General ADL Comments: Pt instructed in safe technique for ADLs and demonstrates good understanding of back precautions.  Discussed use and acquisition of reacher, and LH bath sponge/brush      Vision Baseline Vision/History: Wears glasses Wears Glasses: At all times Patient Visual Report: No change from baseline       Perception     Praxis      Pertinent Vitals/Pain Pain Assessment: Faces Faces Pain Scale: Hurts a little bit Pain Location: low back Pain Descriptors / Indicators: Sore;Operative site guarding;Guarding Pain Intervention(s): Monitored during session     Hand Dominance     Extremity/Trunk Assessment Upper Extremity Assessment Upper Extremity Assessment: Overall WFL for tasks assessed   Lower Extremity Assessment RLE Deficits / Details: Reports RLE symptoms improved.    Cervical / Trunk Assessment Cervical / Trunk Assessment: Normal   Communication Communication Communication: No difficulties   Cognition Arousal/Alertness: Awake/alert Behavior During Therapy: WFL for tasks assessed/performed Overall Cognitive Status: Within Functional Limits for tasks assessed  General Comments  discussed safety with IADL - pt reports wife will assist, or perofm mostly     Exercises     Shoulder Instructions      Home Living Family/patient expects to be discharged to:: Private residence Living Arrangements: Spouse/significant other Available Help at  Discharge: Family;Available 24 hours/day Type of Home: House Home Access: Stairs to enter [employer] of Steps: 4 Entrance Stairs-Rails: Right;Left Home Layout: Able to live on main level with bedroom/bathroom;Two level     Bathroom Shower/Tub: Occupational psychologist: Standard Bathroom Accessibility: Yes   Home Equipment: Environmental consultant - 2 wheels;Shower seat - built in;Bedside commode;Toilet riser;Hand held Tourist information centre manager - 4 wheels          Prior Functioning/Environment Level of Independence: Independent                 OT Problem List: Decreased knowledge of precautions;Pain      OT Treatment/Interventions:      OT Goals(Current goals can be found in the care plan section) Acute Rehab OT Goals Patient Stated Goal: to go home and recuperate  OT Goal Formulation: All assessment and education complete, DC therapy  OT Frequency:     Barriers to D/C:            Co-evaluation              AM-PAC OT "6 Clicks" Daily Activity     Outcome Measure Help from another person eating meals?: None Help from another person taking care of personal grooming?: A Little Help from another person toileting, which includes using toliet, bedpan, or urinal?: A Little Help from another person bathing (including washing, rinsing, drying)?: A Little Help from another person to put on and taking off regular upper body clothing?: A Little Help from another person to put on and taking off regular lower body clothing?: A Little 6 Click Score: 19   End of Session Equipment Utilized During Treatment: Back brace Nurse Communication: Mobility status  Activity Tolerance: Patient tolerated treatment well Patient left: in bed;with call bell/phone within reach (sitting EOB )  OT Visit Diagnosis: Pain Pain - part of body:  (back )                Time: 8786-7672 OT Time Calculation (min): 23 min Charges:  OT General Charges $OT Visit: 1 Visit OT Evaluation $OT Eval  Low Complexity: 1 Low OT Treatments $Self Care/Home Management : 8-22 mins  Nilsa Nutting., OTR/L Acute Rehabilitation Services Pager [phone] Office [phone]   Lucille Passy M 08/13/2020, 9:17 AM

## 2020-08-13 NOTE — Discharge Instructions (Addendum)
Discharge Instructions  Slowly increase your activity back to normal. No bending, no heavy lifting more than 10 pounds. Ambulate daily. Walk straight, sit straight and stand straight. No driving for a week.  Your incision is closed with dermabond (purple glue). This will naturally fall off over the next 1-2 weeks.   Okay to shower for today. Use regular soap and water and try to be gentle when cleaning your incision. Pat the incision dry after shower.  Call Your Doctor If Any of These Occur  Redness, drainage, or swelling at the wound.   Temperature greater than 101 degrees.  Severe pain not relieved by pain medication.  Incision starts to come apart.   Follow up with Dr. Ellene Route in 2 weeks after discharge. If you do not already have a discharge appointment, please call his office at [phone] to schedule a follow up appointment. If you have any concerns or questions, please call the office and let us know.Marland Kitchen

## 2020-08-17 NOTE — Anesthesia Postprocedure Evaluation (Signed)
Anesthesia Post Note  Patient: Miguel Bennett  Procedure(s) Performed: Lumbar five Sacral one Posterior lumbar interbody fusion (N/A Back)     Patient location during evaluation: PACU Anesthesia Type: General Level of consciousness: patient cooperative and awake Pain management: pain level controlled Vital Signs Assessment: post-procedure vital signs reviewed and stable Respiratory status: spontaneous breathing, nonlabored ventilation, respiratory function stable and patient connected to nasal cannula oxygen Cardiovascular status: blood pressure returned to baseline and stable Postop Assessment: no apparent nausea or vomiting Anesthetic complications: no   No complications documented.  Last Vitals:  Vitals:   08/13/20 0406 08/13/20 0737  BP: 101/71 105/68  Pulse: (!) 58 65  Resp: 18 18  Temp: (!) 36.4 C 36.8 C  SpO2: 100% 99%    Last Pain:  Vitals:   08/13/20 0737  TempSrc: Oral  PainSc:                  [name]

## 2021-01-11 ENCOUNTER — Other Ambulatory Visit: Payer: Self-pay | Admitting: Family Medicine

## 2021-01-11 ENCOUNTER — Ambulatory Visit
Admission: RE | Admit: 2021-01-11 | Discharge: 2021-01-11 | Disposition: A | Discharge status: Home / Self Care | Payer: Medicare HMO | Source: Ambulatory Visit | Attending: Family Medicine | Admitting: Family Medicine

## 2021-01-11 DIAGNOSIS — R059 Cough, unspecified: Secondary | ICD-10-CM

## 2021-09-21 ENCOUNTER — Other Ambulatory Visit: Payer: Self-pay | Admitting: Gastroenterology

## 2021-11-08 ENCOUNTER — Ambulatory Visit (HOSPITAL_COMMUNITY): Payer: Medicare HMO | Admitting: Certified Registered"

## 2021-11-08 ENCOUNTER — Encounter (HOSPITAL_COMMUNITY)
Admission: RE | Disposition: A | Discharge status: Home / Self Care | Payer: Self-pay | Source: Home / Self Care | Attending: Gastroenterology

## 2021-11-08 ENCOUNTER — Other Ambulatory Visit: Payer: Self-pay

## 2021-11-08 ENCOUNTER — Encounter (HOSPITAL_COMMUNITY): Payer: Self-pay | Admitting: Gastroenterology

## 2021-11-08 ENCOUNTER — Ambulatory Visit (HOSPITAL_COMMUNITY)
Admission: RE | Admit: 2021-11-08 | Discharge: 2021-11-08 | Disposition: A | Discharge status: Home / Self Care | Payer: Medicare HMO | Attending: Gastroenterology | Admitting: Gastroenterology

## 2021-11-08 DIAGNOSIS — R195 Other fecal abnormalities: Secondary | ICD-10-CM | POA: Diagnosis present

## 2021-11-08 DIAGNOSIS — Z87891 Personal history of nicotine dependence: Secondary | ICD-10-CM | POA: Insufficient documentation

## 2021-11-08 DIAGNOSIS — D124 Benign neoplasm of descending colon: Secondary | ICD-10-CM | POA: Diagnosis not present

## 2021-11-08 DIAGNOSIS — K219 Gastro-esophageal reflux disease without esophagitis: Secondary | ICD-10-CM | POA: Insufficient documentation

## 2021-11-08 DIAGNOSIS — D120 Benign neoplasm of cecum: Secondary | ICD-10-CM | POA: Insufficient documentation

## 2021-11-08 DIAGNOSIS — I10 Essential (primary) hypertension: Secondary | ICD-10-CM | POA: Diagnosis not present

## 2021-11-08 DIAGNOSIS — K5730 Diverticulosis of large intestine without perforation or abscess without bleeding: Secondary | ICD-10-CM | POA: Insufficient documentation

## 2021-11-08 DIAGNOSIS — D123 Benign neoplasm of transverse colon: Secondary | ICD-10-CM | POA: Diagnosis not present

## 2021-11-08 HISTORY — PX: COLONOSCOPY WITH PROPOFOL: SHX5780

## 2021-11-08 HISTORY — PX: POLYPECTOMY: SHX5525

## 2021-11-08 SURGERY — COLONOSCOPY WITH PROPOFOL
Anesthesia: General

## 2021-11-08 MED ORDER — PROPOFOL 500 MG/50ML IV EMUL
INTRAVENOUS | Status: DC | PRN
  Administered 2021-11-08: 150 ug/kg/min via INTRAVENOUS

## 2021-11-08 MED ORDER — PROPOFOL 10 MG/ML IV BOLUS
INTRAVENOUS | Status: DC | PRN
  Administered 2021-11-08: 10 mg via INTRAVENOUS

## 2021-11-08 MED ORDER — SODIUM CHLORIDE 0.9 % IV SOLN: INTRAVENOUS | Status: DC

## 2021-11-08 MED ORDER — LIDOCAINE 2% (20 MG/ML) 5 ML SYRINGE
INTRAMUSCULAR | Status: DC | PRN
  Administered 2021-11-08: 40 mg via INTRAVENOUS

## 2021-11-08 MED ORDER — LACTATED RINGERS IV SOLN: INTRAVENOUS | Status: DC

## 2021-11-08 MED ORDER — LACTATED RINGERS IV SOLN
INTRAVENOUS | Status: AC | PRN
  Administered 2021-11-08: 1000 mL via INTRAVENOUS

## 2021-11-08 SURGICAL SUPPLY — 21 items

## 2021-11-08 NOTE — Transfer of Care (Signed)
Immediate Anesthesia Transfer of Care Note  Patient: Miguel Bennett  Procedure(s) Performed: COLONOSCOPY WITH PROPOFOL POLYPECTOMY  Patient Location: PACU and Endoscopy Unit  Anesthesia Type:MAC  Level of Consciousness: awake, alert  and patient cooperative  Airway & Oxygen Therapy: Patient Spontanous Breathing and Patient connected to face mask oxygen  Post-op Assessment: Report given to RN and Post -op Vital signs reviewed and stable  Post vital signs: Reviewed and stable  Last Vitals:  Vitals Value Taken Time  BP 146/85 11/08/21 1330  Temp    Pulse 62 11/08/21 1330  Resp 22 11/08/21 1330  SpO2 100 % 11/08/21 1330  Vitals shown include unvalidated device data.  Last Pain:  Vitals:   11/08/21 1145  TempSrc: Oral  PainSc: 0-No pain         Complications: No notable events documented.

## 2021-11-08 NOTE — Anesthesia Postprocedure Evaluation (Signed)
Anesthesia Post Note  Patient: Miguel Bennett  Procedure(s) Performed: COLONOSCOPY WITH PROPOFOL POLYPECTOMY     Patient location during evaluation: PACU Anesthesia Type: MAC Level of consciousness: awake Pain management: pain level controlled Vital Signs Assessment: post-procedure vital signs reviewed and stable Respiratory status: spontaneous breathing Cardiovascular status: stable Postop Assessment: no apparent nausea or vomiting Anesthetic complications: no   No notable events documented.  Last Vitals:  Vitals:   11/08/21 1329 11/08/21 1339  BP: (!) 146/85 (!) 156/100  Pulse: 67 (!) 53  Resp: (!) 22 19  Temp: 36.5 C   SpO2: 100% 99%    Last Pain:  Vitals:   11/08/21 1339  TempSrc:   PainSc: 0-No pain                 Alok Minshall

## 2021-11-08 NOTE — Anesthesia Preprocedure Evaluation (Addendum)
Anesthesia Evaluation  Patient identified by MRN, date of birth, ID band Patient awake    Reviewed: Allergy & Precautions, NPO status , Patient's Chart, lab work & pertinent test results  Airway Mallampati: II       Dental   Pulmonary neg pulmonary ROS, former smoker,    breath sounds clear to auscultation       Cardiovascular hypertension,  Rhythm:Regular Rate:Normal     Neuro/Psych negative neurological ROS     GI/Hepatic Neg liver ROS, GERD  ,  Endo/Other  negative endocrine ROS  Renal/GU negative Renal ROS     Musculoskeletal   Abdominal   Peds  Hematology   Anesthesia Other Findings   Reproductive/Obstetrics                            Anesthesia Physical Anesthesia Plan  ASA: 3  Anesthesia Plan: General   Post-op Pain Management:    Induction: Intravenous  PONV Risk Score and Plan: 2  Airway Management Planned: Simple Face Mask and Nasal Cannula  Additional Equipment:   Intra-op Plan:   Post-operative Plan:   Informed Consent: I have reviewed the patients History and Physical, chart, labs and discussed the procedure including the risks, benefits and alternatives for the proposed anesthesia with the patient or authorized representative who has indicated his/her understanding and acceptance.     Dental advisory given  Plan Discussed with: Anesthesiologist and CRNA  Anesthesia Plan Comments:         Anesthesia Quick Evaluation

## 2021-11-08 NOTE — Op Note (Signed)
[facility] Patient Name: Miguel Bennett Procedure Date: 11/08/2021 MRN: [id number] Attending MD: [name] , MD Date of Birth: 02-14-1951 CSN: [id number] Age: 70 Admit Type: Outpatient Procedure:                Colonoscopy Indications:              Positive Cologuard test Providers:                [name], MD, Particia Nearing, RN, Tyna Jaksch                            Technician Referring MD:              Medicines:                 Complications:            No immediate complications. Estimated Blood Loss:     Estimated blood loss was minimal. Procedure:                Pre-Anesthesia Assessment:                           - Prior to the procedure, a History and Physical                            was performed, and patient medications and                            allergies were reviewed. The patient's tolerance of                            previous anesthesia was also reviewed. The risks                            and benefits of the procedure and the sedation                            options and risks were discussed with the patient.                            All questions were answered, and informed consent                            was obtained. Prior Anticoagulants: The patient has                            taken no previous anticoagulant or antiplatelet                            agents. ASA Grade Assessment: III - A patient with                            severe systemic disease. After reviewing the risks                            and benefits, the  patient was deemed in                            satisfactory condition to undergo the procedure.                           - Sedation was administered by an anesthesia                            professional. Deep sedation was attained.                           After obtaining informed consent, the colonoscope                            was passed under direct vision. Throughout the                             procedure, the patient's blood pressure, pulse, and                            oxygen saturations were monitored continuously. The                            CF-HQ190L ([id number] Olympus colonoscope was                            introduced through the anus and advanced to the the                            cecum, identified by appendiceal orifice and                            ileocecal valve. The colonoscopy was performed                            without difficulty. The patient tolerated the                            procedure well. The quality of the bowel                            preparation was evaluated using the BBPS [facility]                            Bowel Preparation Scale) with scores of: Right                            Colon = 2 (minor amount of residual staining, small                            fragments of stool and/or opaque liquid, but mucosa  seen well), Transverse Colon = 2 (minor amount of                            residual staining, small fragments of stool and/or                            opaque liquid, but mucosa seen well) and Left Colon                            = 2 (minor amount of residual staining, small                            fragments of stool and/or opaque liquid, but mucosa                            seen well). The total BBPS score equals 6. The                            quality of the bowel preparation was good. The                            ileocecal valve, appendiceal orifice, and rectum                            were photographed. Scope In: 12:50:22 PM Scope Out: 1:19:43 PM Scope Withdrawal Time: 0 hours 20 minutes 38 seconds  Total Procedure Duration: 0 hours 29 minutes 21 seconds  Findings:      14 sessile polyps were found in the sigmoid colon, descending colon,       transverse colon, ascending colon and cecum. The polyps were 3 to 8 mm       in size. These polyps were removed with a cold snare.  Resection and       retrieval were complete.      Scattered small and large-mouthed diverticula were found in the entire       colon. Impression:               - 14 3 to 8 mm polyps in the sigmoid colon, in the                            descending colon, in the transverse colon, in the                            ascending colon and in the cecum, removed with a                            cold snare. Resected and retrieved.                           - Diverticulosis in the entire examined colon. Moderate Sedation:      Not Applicable - Patient had care per Anesthesia. Recommendation:           - Patient has a contact number available for  emergencies. The signs and symptoms of potential                            delayed complications were discussed with the                            patient. Return to normal activities tomorrow.                            Written discharge instructions were provided to the                            patient.                           - Resume previous diet.                           - Continue present medications.                           - Await pathology results.                           - Repeat colonoscopy in 1 year for surveillance. Procedure Code(s):        --- Professional ---                           [phone], Colonoscopy, flexible; with removal of                            tumor(s), polyp(s), or other lesion(s) by snare                            technique Diagnosis Code(s):        --- Professional ---                           K63.5, Polyp of colon                           R19.5, Other fecal abnormalities                           K57.30, Diverticulosis of large intestine without                            perforation or abscess without bleeding CPT copyright 2019 American Medical Association. All rights reserved. The codes documented in this report are preliminary and upon coder review may  be revised to meet  current compliance requirements. [name], MD [name], MD 11/08/2021 1:27:39 PM This report has been signed electronically. Number of Addenda: 0

## 2021-11-08 NOTE — Discharge Instructions (Signed)

## 2021-11-08 NOTE — Anesthesia Procedure Notes (Signed)
Procedure Name: MAC Date/Time: 11/08/2021 12:44 PM Performed by: Eben Burow, CRNA Pre-anesthesia Checklist: Patient identified, Emergency Drugs available, Suction available, Patient being monitored and Timeout performed Oxygen Delivery Method: Simple face mask Placement Confirmation: positive ETCO2

## 2021-11-08 NOTE — H&P (Signed)
Miguel Bennett HPI: At this time the patient denies any problems with nausea, vomiting, fevers, chills, abdominal pain, diarrhea, constipation, hematochezia, melena, GERD, or dysphagia. The patient denies any known family history of colon cancers. No complaints of chest pain, SOB, MI, or sleep apnea.  With routine testing the patient was found to have a positive Cologuard.  His first colonoscopy was in 2003 with Dr. 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  The follow up examination in 2007 was negative for any recurrence.  A routine follow up in 2012 was with Dr. Wynetta Emery, but this procedure was incomplete.  He required a barium enema to complete the examination and it was very painful for him.  This also brought out a chronic hemorrhoidal irritation.  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 No pertinent family history.  Social History:  reports that he quit smoking about 41 years ago. His smoking use included cigarettes. He has a 12.00 pack-year smoking history. He has never used smokeless tobacco. He reports that he does not currently use alcohol after a past usage  of about 20.0 standard drinks per week. He reports that he does not use drugs.  Allergies:  Allergies  Allergen Reactions   Flomax [Tamsulosin]     Combined with keflex caused pt to passed out   Keflex [Cephalexin]     In combination with flomax caused pt to pass out    Medications: Scheduled: Continuous:  sodium chloride     lactated ringers     lactated ringers 1,000 mL (11/08/21 1209)    No results found for this or any previous visit (from the past 24 hour(s)).   No results found.  ROS:  As stated above in the HPI otherwise negative.  Blood pressure (!) 155/88, pulse 62, temperature 98 F (36.7 C), temperature source Oral, resp. rate 12, height 6' (1.829 m), weight 68 kg, SpO2 100 %.    PE: Gen: NAD, Alert and Oriented HEENT:  [place]/AT, EOMI Neck: Supple, no LAD Lungs: CTA Bilaterally CV: RRR without M/G/R ABD: Soft, NTND, +BS Ext: No C/C/E  Assessment/Plan: 1) Positive Cologuard - colonoscopy.  [name] D 11/08/2021, 12:17 PM

## 2021-11-08 NOTE — Anesthesia Postprocedure Evaluation (Signed)
Anesthesia Post Note  Patient: Miguel Bennett  Procedure(s) Performed: COLONOSCOPY WITH PROPOFOL POLYPECTOMY     Patient location during evaluation: Endoscopy Anesthesia Type: General Level of consciousness: awake Pain management: pain level controlled Vital Signs Assessment: post-procedure vital signs reviewed and stable Respiratory status: spontaneous breathing Cardiovascular status: stable Postop Assessment: no apparent nausea or vomiting Anesthetic complications: no   No notable events documented.  Last Vitals:  Vitals:   11/08/21 1339 11/08/21 1349  BP: (!) 156/100 (!) 169/90  Pulse: (!) 53 (!) 50  Resp: 19 12  Temp:    SpO2: 99% 100%    Last Pain:  Vitals:   11/08/21 1349  TempSrc:   PainSc: 0-No pain                 Raena Pau

## 2021-11-08 NOTE — Anesthesia Postprocedure Evaluation (Signed)
Anesthesia Post Note  Patient: Miguel Bennett  Procedure(s) Performed: COLONOSCOPY WITH PROPOFOL POLYPECTOMY     Patient location during evaluation: Endoscopy Anesthesia Type: General Level of consciousness: awake Pain management: pain level controlled Vital Signs Assessment: post-procedure vital signs reviewed and stable Respiratory status: spontaneous breathing Postop Assessment: no apparent nausea or vomiting Anesthetic complications: no   No notable events documented.  Last Vitals:  Vitals:   11/08/21 1329 11/08/21 1339  BP: (!) 146/85 (!) 156/100  Pulse: 67 (!) 53  Resp: (!) 22 19  Temp: 36.5 C   SpO2: 100% 99%    Last Pain:  Vitals:   11/08/21 1329  TempSrc: Oral  PainSc: 0-No pain                 Harlea Goetzinger

## 2021-11-09 LAB — SURGICAL PATHOLOGY

## 2021-11-10 ENCOUNTER — Encounter (HOSPITAL_COMMUNITY): Payer: Self-pay | Admitting: Gastroenterology

## 2022-04-25 ENCOUNTER — Other Ambulatory Visit: Payer: Self-pay | Admitting: Gastroenterology

## 2022-05-18 ENCOUNTER — Encounter (HOSPITAL_COMMUNITY): Payer: Self-pay | Admitting: Gastroenterology

## 2022-05-19 NOTE — Progress Notes (Signed)
Attempted to obtain medical history via telephone, unable to reach at this time. HIPAA compliant voicemail message left requesting return call to pre surgical testing department. 

## 2022-05-23 ENCOUNTER — Other Ambulatory Visit: Payer: Self-pay

## 2022-05-23 ENCOUNTER — Encounter (HOSPITAL_COMMUNITY): Payer: Self-pay

## 2022-05-23 ENCOUNTER — Emergency Department (HOSPITAL_COMMUNITY)
Admission: EM | Admit: 2022-05-23 | Discharge: 2022-05-23 | Disposition: A | Discharge status: Home / Self Care | Payer: Medicare HMO | Attending: Emergency Medicine | Admitting: Emergency Medicine

## 2022-05-23 ENCOUNTER — Emergency Department (HOSPITAL_COMMUNITY): Payer: Medicare HMO

## 2022-05-23 DIAGNOSIS — I4891 Unspecified atrial fibrillation: Secondary | ICD-10-CM | POA: Insufficient documentation

## 2022-05-23 DIAGNOSIS — Z7901 Long term (current) use of anticoagulants: Secondary | ICD-10-CM | POA: Diagnosis not present

## 2022-05-23 DIAGNOSIS — R42 Dizziness and giddiness: Secondary | ICD-10-CM | POA: Diagnosis present

## 2022-05-23 LAB — CBC
HCT: 42.2 % (ref 39.0–52.0)
Hemoglobin: 14.3 g/dL (ref 13.0–17.0)
MCH: 34.5 pg — ABNORMAL HIGH (ref 26.0–34.0)
MCHC: 33.9 g/dL (ref 30.0–36.0)
MCV: 101.9 fL — ABNORMAL HIGH (ref 80.0–100.0)
Platelets: 267 10*3/uL (ref 150–400)
RBC: 4.14 MIL/uL — ABNORMAL LOW (ref 4.22–5.81)
RDW: 14.2 % (ref 11.5–15.5)
WBC: 7.6 10*3/uL (ref 4.0–10.5)
nRBC: 0 % (ref 0.0–0.2)

## 2022-05-23 LAB — BASIC METABOLIC PANEL
Anion gap: 8 (ref 5–15)
BUN: 19 mg/dL (ref 8–23)
CO2: 23 mmol/L (ref 22–32)
Calcium: 8.9 mg/dL (ref 8.9–10.3)
Chloride: 104 mmol/L (ref 98–111)
Creatinine, Ser: 0.99 mg/dL (ref 0.61–1.24)
GFR, Estimated: >60 mL/min (ref >60)
Glucose, Bld: 90 mg/dL (ref 70–99)
Potassium: 4.8 mmol/L (ref 3.5–5.1)
Sodium: 135 mmol/L (ref 135–145)

## 2022-05-23 LAB — TSH: TSH: 0.927 u[IU]/mL (ref 0.350–4.500)

## 2022-05-23 LAB — MAGNESIUM: Magnesium: 1.7 mg/dL (ref 1.7–2.4)

## 2022-05-23 MED ORDER — APIXABAN 5 MG PO TABS: 5.0000 mg | ORAL_TABLET | Freq: Two times a day (BID) | ORAL | 0 refills | Status: DC

## 2022-05-23 MED ORDER — DILTIAZEM HCL ER COATED BEADS 360 MG PO CP24
360.0000 mg | ORAL_CAPSULE | Freq: Once | ORAL | Status: AC
  Administered 2022-05-23: 360 mg via ORAL
  Filled 2022-05-23 (×2): qty 1

## 2022-05-23 MED ORDER — DILTIAZEM HCL-DEXTROSE 125-5 MG/125ML-% IV SOLN (PREMIX)
5.0000 mg/h | INTRAVENOUS | Status: DC
  Administered 2022-05-23: 5 mg/h via INTRAVENOUS
  Administered 2022-05-23: 10 mg/h via INTRAVENOUS
  Filled 2022-05-23: qty 125

## 2022-05-23 MED ORDER — DILTIAZEM LOAD VIA INFUSION
15.0000 mg | Freq: Once | INTRAVENOUS | Status: AC
  Administered 2022-05-23: 15 mg via INTRAVENOUS
  Filled 2022-05-23: qty 15

## 2022-05-23 MED ORDER — APIXABAN 5 MG PO TABS
5.0000 mg | ORAL_TABLET | Freq: Two times a day (BID) | ORAL | Status: DC
  Administered 2022-05-23: 5 mg via ORAL
  Filled 2022-05-23: qty 1

## 2022-05-23 NOTE — ED Triage Notes (Signed)
Pt BIB [facility] from his PCP today with c/o dizziness that has been lasting since Sunday. Pt usually takes meds for HTN but reports that he stopped taking them on. At his PCP he was found to be in new onset A-Fib at a rate between a high & Low of 100-170 bpm. All VSS with SBP for EMS being 130, 12L was unremarkable. CBG 89, 20g PIV in Rt FA, A/Ox4.

## 2022-05-23 NOTE — ED Provider Notes (Addendum)
[facility] EMERGENCY DEPARTMENT Provider Note   CSN: [id number] Arrival date & time: 05/23/22  1558     History  Chief Complaint  Patient presents with   A-Fib    Miguel Bennett is a 71 y.o. male.  [facility] [place] the [facility] due to concern for atrial fibrillation.  Patient reports that on Saturday he felt a little bit lightheaded and checked his blood pressure he did not have any other symptoms at that time.  Noticed his blood pressure was low in 66A systolic and stopped taking his regular blood pressure medication which included diltiazem and quinapril-HCTZ.  He went to his primary care doctor today and was found to be in atrial fibrillation and sent to ER for further evaluation.  Patient denies any recent GI bleeding episodes, no major surgeries recently.  No prior head bleeding.  Patient does not have any sort of chest pain or palpitations at this time.  He denies any sort of chest pain recently.  HPI     Home Medications Prior to Admission medications   Medication Sig Start Date End Date Taking? Authorizing Provider  apixaban (ELIQUIS) 5 MG TABS tablet Take 1 tablet (5 mg total) by mouth 2 (two) times daily. 05/23/22 06/22/22 Yes Onesha Krebbs, Ellwood Dense, MD  calcium carbonate (TUMS - DOSED IN MG ELEMENTAL CALCIUM) 500 MG chewable tablet Chew 1 tablet by mouth daily as needed for indigestion or heartburn.   Yes [provider]  Carboxymethylcellul-Glycerin (LUBRICATING EYE DROPS OP) Place 1 drop into both eyes daily as needed (dry eyes).   Yes [provider]  Cholecalciferol (VITAMIN D) 50 MCG (2000 UT) tablet Take 2,000 Units by mouth daily.    Yes [provider]  Cyanocobalamin (VITAMIN B-12 PO) Take 1 tablet by mouth daily.    Yes [provider]  diclofenac Sodium (VOLTAREN) 1 % GEL Apply 2 g topically 2 (two) times daily as needed (pain). Applied to neck area 08/13/20  Yes Osie Cheeks, NP  diltiazem (CARDIZEM CD) 180 MG 24 hr  capsule Take 360 mg by mouth daily. 05/18/22  Yes [provider]  ferrous sulfate 325 (65 FE) MG tablet Take 325 mg by mouth daily with breakfast.   Yes [provider]  quinapril-hydrochlorothiazide (ACCURETIC) 20-12.5 MG tablet Take 0.5 tablets by mouth daily.   Yes [provider]  lisinopril-hydrochlorothiazide (ZESTORETIC) 20-12.5 MG tablet Take 1 tablet by mouth daily. 04/10/22   [provider]      Allergies    Flomax [tamsulosin] and Keflex [cephalexin]    Review of Systems   Review of Systems  Constitutional:  Negative for chills and fever.  HENT:  Negative for ear pain and sore throat.   Eyes:  Negative for pain and visual disturbance.  Respiratory:  Negative for cough and shortness of breath.   Cardiovascular:  Negative for chest pain and palpitations.  Gastrointestinal:  Negative for abdominal pain and vomiting.  Genitourinary:  Negative for dysuria and hematuria.  Musculoskeletal:  Negative for arthralgias and back pain.  Skin:  Negative for color change and rash.  Neurological:  Positive for light-headedness. Negative for seizures and syncope.  All other systems reviewed and are negative.   Physical Exam Updated Vital Signs BP 116/85   Pulse 62   Temp 98 F (36.7 C) (Oral)   Resp 18   Ht 6' (1.829 m)   Wt 64 kg   SpO2 100%   BMI 19.12 kg/m  Physical Exam Vitals  and nursing note reviewed.  Constitutional:      General: He is not in acute distress.    Appearance: He is well-developed.  HENT:     Head: Normocephalic and atraumatic.  Eyes:     Conjunctiva/sclera: Conjunctivae normal.  Cardiovascular:     Rate and Rhythm: Normal rate and regular rhythm.     Heart sounds: No murmur heard. Pulmonary:     Effort: Pulmonary effort is normal. No respiratory distress.     Breath sounds: Normal breath sounds.  Abdominal:     Palpations: Abdomen is soft.     Tenderness: There is no abdominal tenderness.  Musculoskeletal:         General: No swelling.     Cervical back: Neck supple.  Skin:    General: Skin is warm and dry.     Capillary Refill: Capillary refill takes less than 2 seconds.  Neurological:     General: No focal deficit present.     Mental Status: He is alert.  Psychiatric:        Mood and Affect: Mood normal.     ED Results / Procedures / Treatments   Labs (all labs ordered are listed, but only abnormal results are displayed) Labs Reviewed  CBC - Abnormal; Notable for the following components:      Result Value   RBC 4.14 (*)    MCV 101.9 (*)    MCH 34.5 (*)    All other components within normal limits  BASIC METABOLIC PANEL  MAGNESIUM  TSH    EKG EKG Interpretation  Date/Time:  Tuesday May 23 2022 16:42:29 EDT Ventricular Rate:  103 PR Interval:    QRS Duration: 90 QT Interval:  336 QTC Calculation: 440 R Axis:   83 Text Interpretation: Atrial fibrillation Borderline right axis deviation Borderline low voltage, extremity leads Confirmed by Madalyn Rob [phone]) on 05/23/2022 6:01:34 PM  Radiology DG Chest Portable 1 View  Result Date: 05/23/2022 CLINICAL DATA:  Cardiac arrhythmia, dizziness EXAM: PORTABLE CHEST 1 VIEW COMPARISON:  01/11/2021 FINDINGS: Cardiac size is within normal limits. Lung fields are clear of any infiltrates or pulmonary edema. There is no pleural effusion or pneumothorax. Surgical clips are noted adjacent to the gastroesophageal junction. IMPRESSION: No active disease. Electronically Signed   By: Elmer Picker M.D.   On: 05/23/2022 17:02    Procedures .Critical Care  Performed by: Lucrezia Starch, MD Authorized by: Lucrezia Starch, MD   Critical care provider statement:    Critical care time (minutes):  36   Critical care was time spent personally by me on the following activities:  Development of treatment plan with patient or surrogate, discussions with consultants, evaluation of patient's response to treatment, examination of patient,  ordering and review of laboratory studies, ordering and review of radiographic studies, ordering and performing treatments and interventions, pulse oximetry, re-evaluation of patient's condition and review of old charts     Medications Ordered in ED Medications  diltiazem (CARDIZEM) 125 mg in dextrose 5% 125 mL (1 mg/mL) infusion (0 mg/hr Intravenous Paused 05/23/22 1822)  apixaban (ELIQUIS) tablet 5 mg (5 mg Oral Given 05/23/22 1726)  diltiazem (CARDIZEM CD) 24 hr capsule 360 mg (has no administration in time range)  diltiazem (CARDIZEM) 1 mg/mL load via infusion 15 mg (15 mg Intravenous Bolus from Bag 05/23/22 1729)    ED Course/ Medical Decision Making/ A&P       CHA2DS2-VASc Score: 2  Medical Decision Making Amount and/or Complexity of Data Reviewed Labs: ordered. Radiology: ordered.  Risk Prescription drug management.   71 year old gentleman presented to ER due to concern for new onset atrial fibrillation.  On physical exam here he was well-appearing in no distress had stable blood pressure.  EKG demonstrating A-fib with RVR.  Not clear regarding timing of onset of symptoms.  Patient currently is asymptomatic though he is in RVR.  Therefore do not feel patient is appropriate for ER cardioversion at this time.  No clear trigger.  Basic labs grossly stable, no electrolyte derangement, no anemia.  No associated chest pain or shortness of breath.  No tachypnea or hypoxia.  Doubt MI or PE.  No fever to suggest sepsis.  Will send TSH.  Gave patient diltiazem bolus and started on diltiazem drip.  His heart rate very quickly normalized though he remained in atrial fibrillation.  The diltiazem drip was paused and patient was observed.  His heart rate remained stable.  Offered admission versus discharge with plan for close outpatient cardiology follow-up.  I discussed timing of rate controlling medications with pharmacy.  Will give dose of his home Cardizem dose now.  Patient would  strongly prefer to go home if possible.  Given his well appearance and ongoing symptoms and stable blood pressure throughout visit and now reasonable heart rate, feel this is a reasonable option. CHADS2VASC score is 2. Patient will be given Rx for Eliquis.  He was given education both by myself and by our ER pharmacist.  Placed order for referral to A-fib clinic.  Instructed patient to follow-up both with cardiology and his primary care doctor.  Because of his reported soft blood pressure over the weekend I advised him to for now only take his home Cardizem but hold his quinapril-HCTZ until further discussion with cards or PCP.  Patient demonstrated good understanding of return precautions and he was discharged home.        Final Clinical Impression(s) / ED Diagnoses Final diagnoses:  Atrial fibrillation, unspecified type [facility])    Rx / DC Orders ED Discharge Orders          Ordered    Amb Referral to AFIB Clinic        05/23/22 2042    apixaban (ELIQUIS) 5 MG TABS tablet  2 times daily        05/23/22 2044              Lucrezia Starch, MD 05/23/22 2051    Lucrezia Starch, MD 05/23/22 2052

## 2022-05-23 NOTE — Discharge Instructions (Addendum)
For now, continue taking the diltiazem (Cardizem) that you are previously prescribed but hold off taking your quinapril-HCTZ until further instructed by cardiology or your primary care.  Call the cardiology office tomorrow to request close follow-up appointment.  I additionally would call your primary care doctor for close follow-up.  Ideally you should be seen by 1 of these doctors within the next couple days.  If you develop any lightheadedness, chest pain, shortness of breath, episodes of passing out or other new concerning symptoms, come back to ER for reassessment.  Information on my medicine - ELIQUIS (apixaban)  Why was Eliquis prescribed for you? Eliquis was prescribed for you to reduce the risk of a blood clot forming that can cause a stroke if you have a medical condition called atrial fibrillation (a type of irregular heartbeat).  What do You need to know about Eliquis ? Take your Eliquis TWICE DAILY - one tablet in the morning and one tablet in the evening with or without food. If you have difficulty swallowing the tablet whole please discuss with your pharmacist how to take the medication safely.  Take Eliquis exactly as prescribed by your doctor and DO NOT stop taking Eliquis without talking to the doctor who prescribed the medication.  Stopping may increase your risk of developing a stroke.  Refill your prescription before you run out.  After discharge, you should have regular check-up appointments with your healthcare provider that is prescribing your Eliquis.  In the future your dose may need to be changed if your kidney function or weight changes by a significant amount or as you get older.  What do you do if you miss a dose? If you miss a dose, take it as soon as you remember on the same day and resume taking twice daily.  Do not take more than one dose of ELIQUIS at the same time to make up a missed dose.  Important Safety Information A possible side effect of Eliquis  is bleeding. You should call your healthcare provider right away if you experience any of the following: Bleeding from an injury or your nose that does not stop. Unusual colored urine (red or dark brown) or unusual colored stools (red or black). Unusual bruising for unknown reasons. A serious fall or if you hit your head (even if there is no bleeding).  Some medicines may interact with Eliquis and might increase your risk of bleeding or clotting while on Eliquis. To help avoid this, consult your healthcare provider or pharmacist prior to using any new prescription or non-prescription medications, including herbals, vitamins, non-steroidal anti-inflammatory drugs (NSAIDs) and supplements.  This website has more information on Eliquis (apixaban): http://www.eliquis.com/eliquis/home

## 2022-05-25 ENCOUNTER — Encounter (HOSPITAL_COMMUNITY): Payer: Self-pay | Admitting: Nurse Practitioner

## 2022-05-25 ENCOUNTER — Ambulatory Visit (HOSPITAL_COMMUNITY)
Admission: RE | Admit: 2022-05-25 | Discharge: 2022-05-25 | Disposition: A | Discharge status: Home / Self Care | Payer: Medicare HMO | Source: Ambulatory Visit | Attending: Nurse Practitioner | Admitting: Nurse Practitioner

## 2022-05-25 ENCOUNTER — Other Ambulatory Visit: Payer: Self-pay

## 2022-05-25 VITALS — BP 114/80 | HR 58 | Ht 72.0 in | Wt 141.8 lb

## 2022-05-25 DIAGNOSIS — C7989 Secondary malignant neoplasm of other specified sites: Secondary | ICD-10-CM | POA: Insufficient documentation

## 2022-05-25 DIAGNOSIS — Z7901 Long term (current) use of anticoagulants: Secondary | ICD-10-CM | POA: Diagnosis not present

## 2022-05-25 DIAGNOSIS — I10 Essential (primary) hypertension: Secondary | ICD-10-CM | POA: Insufficient documentation

## 2022-05-25 DIAGNOSIS — I4891 Unspecified atrial fibrillation: Secondary | ICD-10-CM | POA: Insufficient documentation

## 2022-05-25 DIAGNOSIS — D6869 Other thrombophilia: Secondary | ICD-10-CM

## 2022-05-25 NOTE — Progress Notes (Addendum)
Primary Care Physician: [name], MD Referring Physician: ER f/u    Miguel Bennett is a 71 y.o. male with a h/o HTN, throat CA dx and treated in 2020, that presented to the ER 05/23/22 with being lightheaded and had  stopped his BP meds, for  noting low BP.  Diltiazem was one of the  meds he has been on for yrs for BP. He went to his PCP in the next few days and was found to be in afib with RVR and sent to the ER. Ekg showed Afib with RVR in the 140's. He was given diltiazem IV, rate sufficiently controlled and being fairly asymptomatic, he was d/c home to resume dilt and started eliquis 5 mg bid for a CHA2DS2VASc score of 3. He was referred to the afib clinic.   He continues in afib, rate controlled in the upper 50's. He is holding his quinapril/hctz as his BP is still running on the low side. Overall, he appears to be tolerating afib ok. He has started eliquis and has not missed any doses since starting. He is here with his wife today. He does not smoke, drinks 2 vodka drinks a night and snores per wife with apnea noted at times. No previous sleep study or echo.  Today, he denies symptoms of palpitations, chest pain, orthopnea, PND, lower extremity edema, dizziness, presyncope, syncope, or neurologic sequela. The patient is tolerating medications without difficulties and is otherwise without complaint today.   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   Past Surgical History:  Procedure Laterality Date   CATARACT EXTRACTION W/ INTRAOCULAR LENS  IMPLANT, BILATERAL     CHOLECYSTECTOMY     COLONOSCOPY     COLONOSCOPY WITH PROPOFOL N/A 11/08/2021   Procedure: COLONOSCOPY WITH PROPOFOL;  Surgeon: [name], MD;  [facility];  Service: Endoscopy;  Laterality: N/A;   DIRECT LARYNGOSCOPY Right 03/18/2019   Procedure: DIRECT LARYNGOSCOPY WITH BIOPSY OF RIGHT TONSILLAR MASS;  Surgeon: [name], MD;  Location: [facility];  Service: ENT;  Laterality: Right;   NECK SURGERY     at [facility] to get cancer out of neck area from tonsils   POLYPECTOMY     colon   POLYPECTOMY  11/08/2021   Procedure: POLYPECTOMY;  Surgeon: [name], MD;  [facility]: [facility];  Service: Endoscopy;;   STOMACH SURGERY  1984   40 % of stomach removed due to a bleeding ulcer   TONSILLECTOMY      Current Outpatient Medications  Medication Sig Dispense Refill   apixaban (ELIQUIS) 5 MG TABS tablet Take 1 tablet (5 mg total) by mouth 2 (two) times daily. 60 tablet 0   calcium carbonate (TUMS - DOSED IN MG ELEMENTAL CALCIUM) 500 MG chewable tablet Chew 1 tablet by mouth daily as needed for indigestion or heartburn.     Carboxymethylcellul-Glycerin (LUBRICATING EYE DROPS OP) Place 1 drop into both eyes daily as needed (dry eyes).     Cholecalciferol (VITAMIN D) 50 MCG (2000 UT) tablet Take 2,000 Units by mouth daily.      Cyanocobalamin (VITAMIN B-12 PO) Take 1 tablet by mouth daily.  diclofenac Sodium (VOLTAREN) 1 % GEL Apply 2 g topically 2 (two) times daily as needed (pain). Applied to neck area 2 g 0   diltiazem (CARDIZEM CD) 180 MG 24 hr capsule Take 360 mg by mouth daily.     ferrous sulfate 325 (65 FE) MG tablet Take 325 mg by mouth daily with breakfast.     lisinopril-hydrochlorothiazide (ZESTORETIC) 20-12.5 MG tablet Take 1 tablet by mouth daily. (Patient not taking: Reported on 05/25/2022)     quinapril-hydrochlorothiazide (ACCURETIC) 20-12.5 MG tablet Take 0.5 tablets by mouth daily. (Patient not taking: Reported on 05/25/2022)     No current facility-administered medications for this encounter.    Allergies  Allergen Reactions   Flomax [Tamsulosin]     Combined with keflex caused pt to passed out   Keflex [Cephalexin]      In combination with flomax caused pt to pass out    Social History   Socioeconomic History   Marital status: Married    Spouse name: Not on file   Number of children: Not on file   Years of education: Not on file   Highest education level: Not on file  Occupational History   Not on file  Tobacco Use   Smoking status: Former    Packs/day: 1.00    Years: 12.00    Total pack years: 12.00    Types: Cigarettes    Quit date: 22    Years since quitting: 42.4   Smokeless tobacco: Never  Vaping Use   Vaping Use: Never used  Substance and Sexual Activity   Alcohol use: Not Currently    Alcohol/week: 20.0 standard drinks of alcohol    Types: 20 Standard drinks or equivalent per week    Comment: not in a couple of weeks. 06/17/19   Drug use: Never   Sexual activity: Not on file  Other Topics Concern   Not on file  Social History Narrative   Not on file   Social Determinants of Health   Financial Resource Strain: Not on file  Food Insecurity: Not on file  Transportation Needs: No Transportation Needs (06/17/2019)   PRAPARE - Hydrologist (Medical): No    Lack of Transportation (Non-Medical): No  Physical Activity: Not on file  Stress: Not on file  Social Connections: Not on file  Intimate Partner Violence: Not At Risk (06/17/2019)   Humiliation, Afraid, Rape, and Kick questionnaire    Fear of Current or Ex-Partner: No    Emotionally Abused: No    Physically Abused: No    Sexually Abused: No    No family history on file.  ROS- All systems are reviewed and negative except as per the HPI above  Physical Exam: Vitals:   05/25/22 1531  BP: 114/80  Pulse: (!) 58  Weight: 64.3 kg  Height: 6' (1.829 m)   Wt Readings from Last 3 Encounters:  05/25/22 64.3 kg  05/23/22 64 kg  11/08/21 68 kg    Labs: Lab Results  Component Value Date   NA 135 05/23/2022   K 4.8 05/23/2022   CL 104 05/23/2022   CO2 23 05/23/2022   GLUCOSE 90  05/23/2022   BUN 19 05/23/2022   CREATININE 0.99 05/23/2022   CALCIUM 8.9 05/23/2022   MG 1.7 05/23/2022   No results found for: "INR" No results found for: "CHOL", "HDL", "[place]", "TRIG"   GEN- The patient is well appearing, alert and oriented x 3 today.   Head- normocephalic, atraumatic  Eyes-  Sclera clear, conjunctiva pink Ears- hearing intact Oropharynx- clear Neck- supple, no JVP Lymph- no cervical lymphadenopathy Lungs- Clear to ausculation bilaterally, normal work of breathing Heart- Regular rate and rhythm, no murmurs, rubs or gallops, PMI not laterally displaced GI- soft, NT, ND, + BS Extremities- no clubbing, cyanosis, or edema MS- no significant deformity or atrophy Skin- no rash or lesion Psych- euthymic mood, full affect Neuro- strength and sensation are intact  EKG-afib at 58 bpm, qrs int 84 ms, qtc 404 ms  Epic records reviewed  Last ekg requested from  PCP  as a baseline in SR    Assessment and Plan:  1. New onset afib General education re afib discussed  Triggers discussed  Asked to avoid alcohol  Home sleep study requested for snoring/ witnessed apnea  Continue 180 diltiazem x 2  in the am Echo when returns to SR   2. CHA2DS2VASc  score of at least 2 Continue eliquis 5 mg bid  Reminded not to miss any doses as I anticipate that he may be pending a cardioversion in a few weeks  Bleeding precautions discussed   3. Htn Stable   I will see back in 2 weeks and if stil in afib will set up for cardiovesion after bein pn anticoagulation for 3 weeks   Butch Penny C. Saahil Herbster, [facility] [address] [place], [place] [address] [phone]

## 2022-05-26 ENCOUNTER — Encounter (HOSPITAL_COMMUNITY): Admission: RE | Payer: Self-pay | Source: Home / Self Care

## 2022-05-26 ENCOUNTER — Ambulatory Visit (HOSPITAL_COMMUNITY): Admission: RE | Admit: 2022-05-26 | Payer: Medicare HMO | Source: Home / Self Care | Admitting: Gastroenterology

## 2022-05-26 SURGERY — ESOPHAGOGASTRODUODENOSCOPY (EGD) WITH PROPOFOL
Anesthesia: Monitor Anesthesia Care

## 2022-06-06 ENCOUNTER — Other Ambulatory Visit (HOSPITAL_COMMUNITY): Payer: Self-pay

## 2022-06-06 MED ORDER — APIXABAN 5 MG PO TABS: 5.0000 mg | ORAL_TABLET | Freq: Two times a day (BID) | ORAL | 0 refills | Status: DC

## 2022-06-08 ENCOUNTER — Encounter (HOSPITAL_COMMUNITY): Payer: Self-pay | Admitting: Nurse Practitioner

## 2022-06-08 ENCOUNTER — Ambulatory Visit (HOSPITAL_COMMUNITY)
Admission: RE | Admit: 2022-06-08 | Discharge: 2022-06-08 | Disposition: A | Discharge status: Home / Self Care | Payer: Medicare HMO | Source: Ambulatory Visit | Attending: Nurse Practitioner | Admitting: Nurse Practitioner

## 2022-06-08 VITALS — BP 112/86 | HR 84 | Ht 72.0 in | Wt 143.8 lb

## 2022-06-08 DIAGNOSIS — I10 Essential (primary) hypertension: Secondary | ICD-10-CM | POA: Insufficient documentation

## 2022-06-08 DIAGNOSIS — Z87891 Personal history of nicotine dependence: Secondary | ICD-10-CM | POA: Diagnosis not present

## 2022-06-08 DIAGNOSIS — Z7901 Long term (current) use of anticoagulants: Secondary | ICD-10-CM | POA: Insufficient documentation

## 2022-06-08 DIAGNOSIS — Z79899 Other long term (current) drug therapy: Secondary | ICD-10-CM | POA: Diagnosis not present

## 2022-06-08 DIAGNOSIS — Z8509 Personal history of malignant neoplasm of other digestive organs: Secondary | ICD-10-CM | POA: Insufficient documentation

## 2022-06-08 DIAGNOSIS — Z87442 Personal history of urinary calculi: Secondary | ICD-10-CM | POA: Diagnosis not present

## 2022-06-08 DIAGNOSIS — Z9049 Acquired absence of other specified parts of digestive tract: Secondary | ICD-10-CM | POA: Insufficient documentation

## 2022-06-08 DIAGNOSIS — I4891 Unspecified atrial fibrillation: Secondary | ICD-10-CM | POA: Diagnosis present

## 2022-06-08 DIAGNOSIS — Z888 Allergy status to other drugs, medicaments and biological substances status: Secondary | ICD-10-CM | POA: Diagnosis not present

## 2022-06-08 DIAGNOSIS — K219 Gastro-esophageal reflux disease without esophagitis: Secondary | ICD-10-CM | POA: Insufficient documentation

## 2022-06-08 NOTE — Patient Instructions (Addendum)
Morning of cardioversion - take '180mg'$  of cardizem - if heart rate is stable then you can return to normal dosing of '360mg'$  of cardizem  Cardioversion scheduled for Thursday, July 13th  - Come to clinic for labs at [facility] at the [employer] and go to admitting at [facility] not eat or drink anything after midnight the night prior to your procedure.  - Take all your morning medication (except diabetic medications) with a sip of water prior to arrival.  - You will not be able to drive home after your procedure.  - Do NOT miss any doses of your blood thinner - if you should miss a dose please notify our office immediately.  - If you feel as if you go back into normal rhythm prior to scheduled cardioversion, please notify our office immediately. If your procedure is canceled in the cardioversion suite you will be charged a cancellation fee.

## 2022-06-08 NOTE — Progress Notes (Signed)
Primary Care Physician: [name], MD Referring Physician: ER f/u    Miguel Bennett is a 71 y.o. male with a h/o HTN, throat CA dx and treated in 2020, that presented to the ER 05/23/22 with being lightheaded and had  stopped his BP meds, for  noting low BP.  Diltiazem was one of the  meds he has been on for yrs for BP. He went to his PCP in the next few days and was found to be in afib with RVR and sent to the ER. Ekg showed Afib with RVR in the 140's. He was given diltiazem IV, rate sufficiently controlled and being fairly asymptomatic, he was d/c home to resume dilt and started eliquis 5 mg bid for a CHA2DS2VASc score of 3. He was referred to the afib clinic.   He continues in afib, rate controlled in the upper 50's. He is holding his quinapril/hctz as his BP is still running on the low side. Overall, he appears to be tolerating afib ok. He has started eliquis 6/14, and has not missed any doses since starting. He is here with his wife today. He does not smoke, drinks 2 vodka drinks a night and snores per wife with apnea noted at times. No previous sleep study or echo.  Today, he denies symptoms of palpitations, chest pain, orthopnea, PND, lower extremity edema, dizziness, presyncope, syncope, or neurologic sequela. The patient is tolerating medications without difficulties and is otherwise without complaint today.   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   Past Surgical History:  Procedure Laterality Date   CATARACT EXTRACTION W/ INTRAOCULAR LENS  IMPLANT, BILATERAL     CHOLECYSTECTOMY     COLONOSCOPY     COLONOSCOPY WITH PROPOFOL N/A 11/08/2021   Procedure: COLONOSCOPY WITH PROPOFOL;  Surgeon: [name], MD;  [facility];  Service: Endoscopy;  Laterality: N/A;   DIRECT LARYNGOSCOPY Right 03/18/2019   Procedure: DIRECT LARYNGOSCOPY WITH BIOPSY OF RIGHT TONSILLAR MASS;  Surgeon: [name], MD;  Location: [facility];  Service: ENT;  Laterality: Right;   NECK SURGERY     at [facility] to get cancer out of neck area from tonsils   POLYPECTOMY     colon   POLYPECTOMY  11/08/2021   Procedure: POLYPECTOMY;  Surgeon: [name], MD;  [facility];  Service: Endoscopy;;   STOMACH SURGERY  1984   40 % of stomach removed due to a bleeding ulcer   TONSILLECTOMY      Current Outpatient Medications  Medication Sig Dispense Refill   apixaban (ELIQUIS) 5 MG TABS tablet Take 1 tablet (5 mg total) by mouth 2 (two) times daily. 180 tablet 0   calcium carbonate (TUMS - DOSED IN MG ELEMENTAL CALCIUM) 500 MG chewable tablet Chew 1 tablet by mouth daily as needed for indigestion or heartburn.     Carboxymethylcellul-Glycerin (LUBRICATING EYE DROPS OP) Place 1 drop into both eyes daily as needed (dry eyes).     Cholecalciferol (VITAMIN D) 50 MCG (2000 UT) tablet Take 2,000 Units by mouth daily.      Cyanocobalamin (VITAMIN B-12 PO) Take 1 tablet by mouth daily.  diclofenac Sodium (VOLTAREN) 1 % GEL Apply 2 g topically 2 (two) times daily as needed (pain). Applied to neck area 2 g 0   diltiazem (CARDIZEM CD) 180 MG 24 hr capsule Take 360 mg by mouth daily.     ferrous sulfate 325 (65 FE) MG tablet Take 325 mg by mouth daily with breakfast.     lisinopril-hydrochlorothiazide (ZESTORETIC) 20-12.5 MG tablet Take 1 tablet by mouth daily. (Patient not taking: Reported on 06/08/2022)     tadalafil (CIALIS) 20 MG tablet Take 1/2 to one tablet as needed for ED (Patient not taking: Reported on 06/08/2022)     No current facility-administered medications for this encounter.    Allergies  Allergen Reactions   Flomax [Tamsulosin]     Combined with keflex caused pt to passed out   Keflex [Cephalexin]     In combination  with flomax caused pt to pass out    Social History   Socioeconomic History   Marital status: Married    Spouse name: Not on file   Number of children: Not on file   Years of education: Not on file   Highest education level: Not on file  Occupational History   Not on file  Tobacco Use   Smoking status: Former    Packs/day: 1.00    Years: 12.00    Total pack years: 12.00    Types: Cigarettes    Quit date: 80    Years since quitting: 42.5   Smokeless tobacco: Never  Vaping Use   Vaping Use: Never used  Substance and Sexual Activity   Alcohol use: Not Currently    Alcohol/week: 20.0 standard drinks of alcohol    Types: 20 Standard drinks or equivalent per week    Comment: not in a couple of weeks. 06/17/19   Drug use: Never   Sexual activity: Not on file  Other Topics Concern   Not on file  Social History Narrative   Not on file   Social Determinants of Health   Financial Resource Strain: Not on file  Food Insecurity: Not on file  Transportation Needs: No Transportation Needs (06/17/2019)   PRAPARE - Hydrologist (Medical): No    Lack of Transportation (Non-Medical): No  Physical Activity: Not on file  Stress: Not on file  Social Connections: Not on file  Intimate Partner Violence: Not At Risk (06/17/2019)   Humiliation, Afraid, Rape, and Kick questionnaire    Fear of Current or Ex-Partner: No    Emotionally Abused: No    Physically Abused: No    Sexually Abused: No    No family history on file.  ROS- All systems are reviewed and negative except as per the HPI above  Physical Exam: Vitals:   06/08/22 1407  BP: 112/86  Pulse: 84  Weight: 65.2 kg  Height: 6' (1.829 m)   Wt Readings from Last 3 Encounters:  06/08/22 65.2 kg  05/25/22 64.3 kg  05/23/22 64 kg    Labs: Lab Results  Component Value Date   NA 135 05/23/2022   K 4.8 05/23/2022   CL 104 05/23/2022   CO2 23 05/23/2022   GLUCOSE 90 05/23/2022   BUN 19  05/23/2022   CREATININE 0.99 05/23/2022   CALCIUM 8.9 05/23/2022   MG 1.7 05/23/2022   No results found for: "INR" No results found for: "CHOL", "HDL", "[place]", "TRIG"   GEN- The patient is well appearing, alert and oriented x 3 today.   Head-  normocephalic, atraumatic Eyes-  Sclera clear, conjunctiva pink Ears- hearing intact Oropharynx- clear Neck- supple, no JVP Lymph- no cervical lymphadenopathy Lungs- Clear to ausculation bilaterally, normal work of breathing Heart- irregular rate and rhythm, no murmurs, rubs or gallops, PMI not laterally displaced GI- soft, NT, ND, + BS Extremities- no clubbing, cyanosis, or edema MS- no significant deformity or atrophy Skin- no rash or lesion Psych- euthymic mood, full affect Neuro- strength and sensation are intact  EKG- afib at 84 bpm, qrs int 86 ms, qtc 439 ms  Epic records reviewed  Last ekg requested from  PCP  as a baseline in SR    Assessment and Plan:  1. New onset afib He is now 2 weeks on anticoagulation without missed doses, eligible for cardioversion 7/5  Risk vrs benefit of cardioversion discussed with pt, he and wife want  to proceed  Asked to avoid alcohol  Home sleep study requested for snoring/ witnessed apnea  Continue 180 diltiazem x 2  in the am, he has been on years for HTN at this dose, but am of cardioversion just take 180 mg Cardizem until HR in SR can be further assessed Echo when returns to SR  Cbc/bmet  2. CHA2DS2VASc  score of at least 2 Continue eliquis 5 mg bid  Reminded not to miss any doses prior to cardioversion   3. Htn Stable    I will see back in one week after cardioversion   Butch Penny C. Cirilo Canner, [facility] [address] [place], [place] [address] [phone]

## 2022-06-08 NOTE — H&P (View-Only) (Signed)
Primary Care Physician: [name], MD Referring Physician: ER f/u    Miguel Bennett is a 71 y.o. male with a h/o HTN, throat CA dx and treated in 2020, that presented to the ER 05/23/22 with being lightheaded and had  stopped his BP meds, for  noting low BP.  Diltiazem was one of the  meds he has been on for yrs for BP. He went to his PCP in the next few days and was found to be in afib with RVR and sent to the ER. Ekg showed Afib with RVR in the 140's. He was given diltiazem IV, rate sufficiently controlled and being fairly asymptomatic, he was d/c home to resume dilt and started eliquis 5 mg bid for a CHA2DS2VASc score of 3. He was referred to the afib clinic.   He continues in afib, rate controlled in the upper 50's. He is holding his quinapril/hctz as his BP is still running on the low side. Overall, he appears to be tolerating afib ok. He has started eliquis 6/14, and has not missed any doses since starting. He is here with his wife today. He does not smoke, drinks 2 vodka drinks a night and snores per wife with apnea noted at times. No previous sleep study or echo.  Today, he denies symptoms of palpitations, chest pain, orthopnea, PND, lower extremity edema, dizziness, presyncope, syncope, or neurologic sequela. The patient is tolerating medications without difficulties and is otherwise without complaint today.   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   Past Surgical History:  Procedure Laterality Date   CATARACT EXTRACTION W/ INTRAOCULAR LENS  IMPLANT, BILATERAL     CHOLECYSTECTOMY     COLONOSCOPY     COLONOSCOPY WITH PROPOFOL N/A 11/08/2021   Procedure: COLONOSCOPY WITH PROPOFOL;  Surgeon: [name], MD;  [facility];  Service: Endoscopy;  Laterality: N/A;   DIRECT LARYNGOSCOPY Right 03/18/2019   Procedure: DIRECT LARYNGOSCOPY WITH BIOPSY OF RIGHT TONSILLAR MASS;  Surgeon: [name], MD;  Location: [facility];  Service: ENT;  Laterality: Right;   NECK SURGERY     at [facility] to get cancer out of neck area from tonsils   POLYPECTOMY     colon   POLYPECTOMY  11/08/2021   Procedure: POLYPECTOMY;  Surgeon: [name], MD;  [facility];  Service: Endoscopy;;   STOMACH SURGERY  1984   40 % of stomach removed due to a bleeding ulcer   TONSILLECTOMY      Current Outpatient Medications  Medication Sig Dispense Refill   apixaban (ELIQUIS) 5 MG TABS tablet Take 1 tablet (5 mg total) by mouth 2 (two) times daily. 180 tablet 0   calcium carbonate (TUMS - DOSED IN MG ELEMENTAL CALCIUM) 500 MG chewable tablet Chew 1 tablet by mouth daily as needed for indigestion or heartburn.     Carboxymethylcellul-Glycerin (LUBRICATING EYE DROPS OP) Place 1 drop into both eyes daily as needed (dry eyes).     Cholecalciferol (VITAMIN D) 50 MCG (2000 UT) tablet Take 2,000 Units by mouth daily.      Cyanocobalamin (VITAMIN B-12 PO) Take 1 tablet by mouth daily.  diclofenac Sodium (VOLTAREN) 1 % GEL Apply 2 g topically 2 (two) times daily as needed (pain). Applied to neck area 2 g 0   diltiazem (CARDIZEM CD) 180 MG 24 hr capsule Take 360 mg by mouth daily.     ferrous sulfate 325 (65 FE) MG tablet Take 325 mg by mouth daily with breakfast.     lisinopril-hydrochlorothiazide (ZESTORETIC) 20-12.5 MG tablet Take 1 tablet by mouth daily. (Patient not taking: Reported on 06/08/2022)     tadalafil (CIALIS) 20 MG tablet Take 1/2 to one tablet as needed for ED (Patient not taking: Reported on 06/08/2022)     No current facility-administered medications for this encounter.    Allergies  Allergen Reactions   Flomax [Tamsulosin]     Combined with keflex caused pt to passed out   Keflex [Cephalexin]     In combination  with flomax caused pt to pass out    Social History   Socioeconomic History   Marital status: Married    Spouse name: Not on file   Number of children: Not on file   Years of education: Not on file   Highest education level: Not on file  Occupational History   Not on file  Tobacco Use   Smoking status: Former    Packs/day: 1.00    Years: 12.00    Total pack years: 12.00    Types: Cigarettes    Quit date: 5    Years since quitting: 42.5   Smokeless tobacco: Never  Vaping Use   Vaping Use: Never used  Substance and Sexual Activity   Alcohol use: Not Currently    Alcohol/week: 20.0 standard drinks of alcohol    Types: 20 Standard drinks or equivalent per week    Comment: not in a couple of weeks. 06/17/19   Drug use: Never   Sexual activity: Not on file  Other Topics Concern   Not on file  Social History Narrative   Not on file   Social Determinants of Health   Financial Resource Strain: Not on file  Food Insecurity: Not on file  Transportation Needs: No Transportation Needs (06/17/2019)   PRAPARE - Hydrologist (Medical): No    Lack of Transportation (Non-Medical): No  Physical Activity: Not on file  Stress: Not on file  Social Connections: Not on file  Intimate Partner Violence: Not At Risk (06/17/2019)   Humiliation, Afraid, Rape, and Kick questionnaire    Fear of Current or Ex-Partner: No    Emotionally Abused: No    Physically Abused: No    Sexually Abused: No    No family history on file.  ROS- All systems are reviewed and negative except as per the HPI above  Physical Exam: Vitals:   06/08/22 1407  BP: 112/86  Pulse: 84  Weight: 65.2 kg  Height: 6' (1.829 m)   Wt Readings from Last 3 Encounters:  06/08/22 65.2 kg  05/25/22 64.3 kg  05/23/22 64 kg    Labs: Lab Results  Component Value Date   NA 135 05/23/2022   K 4.8 05/23/2022   CL 104 05/23/2022   CO2 23 05/23/2022   GLUCOSE 90 05/23/2022   BUN 19  05/23/2022   CREATININE 0.99 05/23/2022   CALCIUM 8.9 05/23/2022   MG 1.7 05/23/2022   No results found for: "INR" No results found for: "CHOL", "HDL", "[place]", "TRIG"   GEN- The patient is well appearing, alert and oriented x 3 today.   Head-  normocephalic, atraumatic Eyes-  Sclera clear, conjunctiva pink Ears- hearing intact Oropharynx- clear Neck- supple, no JVP Lymph- no cervical lymphadenopathy Lungs- Clear to ausculation bilaterally, normal work of breathing Heart- irregular rate and rhythm, no murmurs, rubs or gallops, PMI not laterally displaced GI- soft, NT, ND, + BS Extremities- no clubbing, cyanosis, or edema MS- no significant deformity or atrophy Skin- no rash or lesion Psych- euthymic mood, full affect Neuro- strength and sensation are intact  EKG- afib at 84 bpm, qrs int 86 ms, qtc 439 ms  Epic records reviewed  Last ekg requested from  PCP  as a baseline in SR    Assessment and Plan:  1. New onset afib He is now 2 weeks on anticoagulation without missed doses, eligible for cardioversion 7/5  Risk vrs benefit of cardioversion discussed with pt, he and wife want  to proceed  Asked to avoid alcohol  Home sleep study requested for snoring/ witnessed apnea  Continue 180 diltiazem x 2  in the am, he has been on years for HTN at this dose, but am of cardioversion just take 180 mg Cardizem until HR in SR can be further assessed Echo when returns to SR  Cbc/bmet  2. CHA2DS2VASc  score of at least 2 Continue eliquis 5 mg bid  Reminded not to miss any doses prior to cardioversion   3. Htn Stable    I will see back in one week after cardioversion   Butch Penny C. Myleah Cavendish, [facility] [address] [place], [place] [address] [phone]

## 2022-06-15 ENCOUNTER — Encounter (HOSPITAL_COMMUNITY): Payer: Self-pay | Admitting: Cardiology

## 2022-06-22 ENCOUNTER — Encounter (HOSPITAL_COMMUNITY)
Admission: RE | Disposition: A | Discharge status: Home / Self Care | Payer: Self-pay | Source: Home / Self Care | Attending: Cardiology

## 2022-06-22 ENCOUNTER — Ambulatory Visit (HOSPITAL_COMMUNITY)
Admission: RE | Admit: 2022-06-22 | Discharge: 2022-06-22 | Disposition: A | Discharge status: Home / Self Care | Payer: Medicare HMO | Attending: Cardiology | Admitting: Cardiology

## 2022-06-22 ENCOUNTER — Ambulatory Visit (HOSPITAL_COMMUNITY)
Admission: RE | Admit: 2022-06-22 | Discharge: 2022-06-22 | Disposition: A | Discharge status: Home / Self Care | Payer: Medicare HMO | Source: Ambulatory Visit | Attending: Nurse Practitioner | Admitting: Nurse Practitioner

## 2022-06-22 ENCOUNTER — Ambulatory Visit (HOSPITAL_BASED_OUTPATIENT_CLINIC_OR_DEPARTMENT_OTHER): Payer: Medicare HMO | Admitting: Anesthesiology

## 2022-06-22 ENCOUNTER — Ambulatory Visit (HOSPITAL_COMMUNITY): Payer: Medicare HMO | Admitting: Anesthesiology

## 2022-06-22 ENCOUNTER — Encounter (HOSPITAL_COMMUNITY): Payer: Self-pay | Admitting: Cardiology

## 2022-06-22 VITALS — HR 77

## 2022-06-22 DIAGNOSIS — I4891 Unspecified atrial fibrillation: Secondary | ICD-10-CM

## 2022-06-22 DIAGNOSIS — Z7901 Long term (current) use of anticoagulants: Secondary | ICD-10-CM | POA: Insufficient documentation

## 2022-06-22 DIAGNOSIS — I10 Essential (primary) hypertension: Secondary | ICD-10-CM | POA: Insufficient documentation

## 2022-06-22 DIAGNOSIS — Z87891 Personal history of nicotine dependence: Secondary | ICD-10-CM

## 2022-06-22 DIAGNOSIS — Z8521 Personal history of malignant neoplasm of larynx: Secondary | ICD-10-CM | POA: Insufficient documentation

## 2022-06-22 HISTORY — PX: CARDIOVERSION: SHX1299

## 2022-06-22 LAB — CBC
HCT: 46.2 % (ref 39.0–52.0)
Hemoglobin: 15.6 g/dL (ref 13.0–17.0)
MCH: 33.4 pg (ref 26.0–34.0)
MCHC: 33.8 g/dL (ref 30.0–36.0)
MCV: 98.9 fL (ref 80.0–100.0)
Platelets: 271 10*3/uL (ref 150–400)
RBC: 4.67 MIL/uL (ref 4.22–5.81)
RDW: 14.2 % (ref 11.5–15.5)
WBC: 5.5 10*3/uL (ref 4.0–10.5)
nRBC: 0 % (ref 0.0–0.2)

## 2022-06-22 LAB — BASIC METABOLIC PANEL
Anion gap: 10 (ref 5–15)
BUN: 14 mg/dL (ref 8–23)
CO2: 26 mmol/L (ref 22–32)
Calcium: 10.1 mg/dL (ref 8.9–10.3)
Chloride: 101 mmol/L (ref 98–111)
Creatinine, Ser: 0.99 mg/dL (ref 0.61–1.24)
GFR, Estimated: >60 mL/min (ref >60)
Glucose, Bld: 103 mg/dL — ABNORMAL HIGH (ref 70–99)
Potassium: 4.8 mmol/L (ref 3.5–5.1)
Sodium: 137 mmol/L (ref 135–145)

## 2022-06-22 SURGERY — CARDIOVERSION
Anesthesia: General

## 2022-06-22 MED ORDER — LIDOCAINE HCL (CARDIAC) PF 100 MG/5ML IV SOSY
PREFILLED_SYRINGE | INTRAVENOUS | Status: DC | PRN
  Administered 2022-06-22: 50 mg via INTRAVENOUS

## 2022-06-22 MED ORDER — SODIUM CHLORIDE 0.9 % IV SOLN: INTRAVENOUS | Status: DC

## 2022-06-22 MED ORDER — PROPOFOL 10 MG/ML IV BOLUS
INTRAVENOUS | Status: DC | PRN
  Administered 2022-06-22: 60 mg via INTRAVENOUS

## 2022-06-22 MED ORDER — SODIUM CHLORIDE 0.9 % IV SOLN
INTRAVENOUS | Status: AC | PRN
  Administered 2022-06-22: 500 mL via INTRAMUSCULAR

## 2022-06-22 NOTE — CV Procedure (Signed)
Procedure:   DCCV  Indication:  Symptomatic atrial fibrillation  Procedure Note:  The patient signed informed consent.  They have had had therapeutic anticoagulation with apixaban greater than 3 weeks.  Anesthesia was administered by Dr. Ambrose Pancoast.  Patient received 50 mg IV lidocaine and 60 mg IV propofol. Adequate airway was maintained throughout and vital followed per protocol.  They were cardioverted x 1 with 120J of biphasic synchronized energy.  They converted to NSR.  There were no apparent complications.  The patient had normal neuro status and respiratory status post procedure with vitals stable as recorded elsewhere.    Follow up:  They will continue on current medical therapy and follow up with cardiology as scheduled.  [name], MD PhD 06/22/2022 11:48 AM

## 2022-06-22 NOTE — Anesthesia Preprocedure Evaluation (Addendum)
Anesthesia Evaluation  Patient identified by MRN, date of birth, ID band Patient awake    Reviewed: Allergy & Precautions, NPO status , Patient's Chart, lab work & pertinent test results  Airway Mallampati: II       Dental  (+) Teeth Intact, Dental Advisory Given, Poor Dentition   Pulmonary neg pulmonary ROS, former smoker,    breath sounds clear to auscultation       Cardiovascular hypertension, Pt. on medications  Rhythm:Regular Rate:Normal     Neuro/Psych negative neurological ROS     GI/Hepatic Neg liver ROS, GERD  Medicated,  Endo/Other  negative endocrine ROS  Renal/GU negative Renal ROS     Musculoskeletal   Abdominal   Peds  Hematology   Anesthesia Other Findings   Reproductive/Obstetrics                            Anesthesia Physical  Anesthesia Plan  ASA: 3  Anesthesia Plan: General   Post-op Pain Management: Minimal or no pain anticipated   Induction: Intravenous  PONV Risk Score and Plan: 2 and Propofol infusion  Airway Management Planned: Simple Face Mask and Nasal Cannula  Additional Equipment:   Intra-op Plan:   Post-operative Plan:   Informed Consent: I have reviewed the patients History and Physical, chart, labs and discussed the procedure including the risks, benefits and alternatives for the proposed anesthesia with the patient or authorized representative who has indicated his/her understanding and acceptance.     Dental advisory given  Plan Discussed with: Anesthesiologist and CRNA  Anesthesia Plan Comments:         Anesthesia Quick Evaluation

## 2022-06-22 NOTE — Interval H&P Note (Signed)
History and Physical Interval Note:  06/22/2022 11:43 AM  Miguel Bennett  has presented today for surgery, with the diagnosis of afib.  The various methods of treatment have been discussed with the patient and family. After consideration of risks, benefits and other options for treatment, the patient has consented to  Procedure(s): CARDIOVERSION (N/A) as a surgical intervention.  The patient's history has been reviewed, patient examined, no change in status, stable for surgery.  I have reviewed the patient's chart and labs.  Questions were answered to the patient's satisfaction.     Kaiyan Luczak Harrell Gave

## 2022-06-22 NOTE — Discharge Instructions (Signed)

## 2022-06-22 NOTE — Transfer of Care (Signed)
Immediate Anesthesia Transfer of Care Note  Patient: Miguel Bennett  Procedure(s) Performed: CARDIOVERSION  Patient Location: Endoscopy Unit  Anesthesia Type:MAC  Level of Consciousness: awake  Airway & Oxygen Therapy: Patient Spontanous Breathing  Post-op Assessment: Report given to RN and Post -op Vital signs reviewed and stable  Post vital signs: Reviewed and stable  Last Vitals:  Vitals Value Taken Time  BP    Temp    Pulse    Resp    SpO2      Last Pain:  Vitals:   06/22/22 1059  TempSrc: Oral  PainSc: 0-No pain         Complications: No notable events documented.

## 2022-06-22 NOTE — Anesthesia Postprocedure Evaluation (Signed)
Anesthesia Post Note  Patient: Miguel Bennett  Procedure(s) Performed: CARDIOVERSION     Patient location during evaluation: PACU Anesthesia Type: General Level of consciousness: awake and alert Pain management: pain level controlled Vital Signs Assessment: post-procedure vital signs reviewed and stable Respiratory status: spontaneous breathing, nonlabored ventilation, respiratory function stable and patient connected to nasal cannula oxygen Cardiovascular status: blood pressure returned to baseline and stable Postop Assessment: no apparent nausea or vomiting Anesthetic complications: no   No notable events documented.  Last Vitals:  Vitals:   06/22/22 1200 06/22/22 1210  BP: 114/86 115/86  Pulse: (!) 59 (!) 59  Resp: (!) 21 13  Temp:    SpO2: 96% 97%    Last Pain:  Vitals:   06/22/22 1200  TempSrc:   PainSc: 0-No pain                 Aireanna Luellen

## 2022-06-22 NOTE — Progress Notes (Signed)
Ekg and labs done for pending  cardioversion this am . Remains in afib at 77 bpm, to endo.

## 2022-06-24 ENCOUNTER — Telehealth: Payer: Self-pay | Admitting: Student

## 2022-06-24 NOTE — Telephone Encounter (Signed)
    The patient and his wife called the after-hours line reporting that his BP has been elevated since recent DCCV. His heart rate has been well controlled in the 60's but blood pressure has continued to increase. This was at 139/78 yesterday and has increased with SBP in the 150's to 160's today. He was on both Cardizem CD and Quinapril-HCTZ (1/2 tablet) prior to his atrial fibrillation diagnosis and has remained on Cardizem CD but they question if he should restart Quinapril-HCTZ. Given his elevated BP, I did recommend restarting this at his prior dosing and for them to continue to follow readings at home. He will need a repeat BMET at the time of his office visit on 07/06/2022.  Signed, Erma Heritage, PA-C 06/24/2022, 3:40 PM Pager: [phone]

## 2022-07-06 ENCOUNTER — Ambulatory Visit (HOSPITAL_COMMUNITY)
Admission: RE | Admit: 2022-07-06 | Discharge: 2022-07-06 | Disposition: A | Discharge status: Home / Self Care | Payer: Medicare HMO | Source: Ambulatory Visit | Attending: Nurse Practitioner | Admitting: Nurse Practitioner

## 2022-07-06 VITALS — BP 114/68 | HR 58 | Ht 72.0 in | Wt 145.6 lb

## 2022-07-06 DIAGNOSIS — I491 Atrial premature depolarization: Secondary | ICD-10-CM

## 2022-07-06 DIAGNOSIS — Z79899 Other long term (current) drug therapy: Secondary | ICD-10-CM | POA: Diagnosis not present

## 2022-07-06 DIAGNOSIS — I4891 Unspecified atrial fibrillation: Secondary | ICD-10-CM | POA: Insufficient documentation

## 2022-07-06 DIAGNOSIS — R0683 Snoring: Secondary | ICD-10-CM | POA: Insufficient documentation

## 2022-07-06 DIAGNOSIS — R0681 Apnea, not elsewhere classified: Secondary | ICD-10-CM | POA: Insufficient documentation

## 2022-07-06 DIAGNOSIS — D6869 Other thrombophilia: Secondary | ICD-10-CM

## 2022-07-06 DIAGNOSIS — I10 Essential (primary) hypertension: Secondary | ICD-10-CM | POA: Insufficient documentation

## 2022-07-06 DIAGNOSIS — I493 Ventricular premature depolarization: Secondary | ICD-10-CM | POA: Insufficient documentation

## 2022-07-06 DIAGNOSIS — Z8589 Personal history of malignant neoplasm of other organs and systems: Secondary | ICD-10-CM | POA: Diagnosis not present

## 2022-07-06 LAB — BASIC METABOLIC PANEL
Anion gap: 8 (ref 5–15)
BUN: 18 mg/dL (ref 8–23)
CO2: 26 mmol/L (ref 22–32)
Calcium: 9.4 mg/dL (ref 8.9–10.3)
Chloride: 100 mmol/L (ref 98–111)
Creatinine, Ser: 1.14 mg/dL (ref 0.61–1.24)
GFR, Estimated: >60 mL/min (ref >60)
Glucose, Bld: 97 mg/dL (ref 70–99)
Potassium: 4.8 mmol/L (ref 3.5–5.1)
Sodium: 134 mmol/L — ABNORMAL LOW (ref 135–145)

## 2022-07-06 NOTE — Addendum Note (Signed)
Encounter addended by: Sherran Needs, NP on: 07/06/2022 4:12 PM  Actions taken: Clinical Note Signed

## 2022-07-06 NOTE — Progress Notes (Addendum)
Primary Care Physician: [name], MD Referring Physician: ER f/u    Miguel Bennett is a 71 y.o. male with a h/o HTN, throat CA dx and treated in 2020, that presented to the ER 05/23/22 with being lightheaded and had  stopped his BP meds, for  noting low BP.  Diltiazem was one of the  meds he has been on for yrs for BP. He went to his PCP in the next few days and was found to be in afib with RVR and sent to the ER. Ekg showed Afib with RVR in the 140's. He was given diltiazem IV, rate sufficiently controlled and being fairly asymptomatic, he was d/c home to resume dilt and started eliquis 5 mg bid for a CHA2DS2VASc score of 3. He was referred to the afib clinic.   He continues in afib, rate controlled in the upper 50's. He is holding his quinapril/hctz as his BP is still running on the low side. Overall, he appears to be tolerating afib ok. He has started eliquis 6/14, and has not missed any doses since starting. He is here with his wife today. He does not smoke, drinks 2 vodka drinks a night and snores per wife with apnea noted at times. No previous sleep study or echo.  Back in afib clinic 07/06/22 after successful cardioversion. He had elevated BP after cardioversion and restarted quinapril/hct. His BP today is well controlled and EKG shows SR with bigeminal PVC's. He doesn't not think he is better but his wife has noted more energy. Home sleep study is pending.   Today, he denies symptoms of palpitations, chest pain, orthopnea, PND, lower extremity edema, dizziness, presyncope, syncope, or neurologic sequela. The patient is tolerating medications without difficulties and is otherwise without complaint today.   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   GERD (gastroesophageal reflux disease)    occasionally   History of kidney stones    Hypertension    Neck tightness    due to previous  neck surgery due to  tonsilar cancer   Past Surgical History:  Procedure Laterality Date   CARDIOVERSION N/A 06/22/2022   Procedure: CARDIOVERSION;  Surgeon: [name], MD;  Location: [facility];  Service: Cardiovascular;  Laterality: N/A;   CATARACT EXTRACTION W/ INTRAOCULAR LENS  IMPLANT, BILATERAL     CHOLECYSTECTOMY     COLONOSCOPY     COLONOSCOPY WITH PROPOFOL N/A 11/08/2021   Procedure: COLONOSCOPY WITH PROPOFOL;  Surgeon: [name], MD;  Location: [facility];  Service: Endoscopy;  Laterality: N/A;   DIRECT LARYNGOSCOPY Right 03/18/2019   Procedure: DIRECT LARYNGOSCOPY WITH BIOPSY OF RIGHT TONSILLAR MASS;  Surgeon: [name], MD;  Location: [facility];  Service: ENT;  Laterality: Right;   NECK SURGERY     at [facility] to get cancer out of neck area from tonsils   POLYPECTOMY     colon   POLYPECTOMY  11/08/2021   Procedure: POLYPECTOMY;  Surgeon: [name], MD;  Location: [facility];  Service: Endoscopy;;   STOMACH SURGERY  1984   40 % of stomach removed due to a bleeding ulcer   TONSILLECTOMY      Current Outpatient Medications  Medication Sig Dispense Refill   apixaban (ELIQUIS) 5 MG TABS tablet Take 1 tablet (5 mg total) by mouth 2 (two) times daily. 180 tablet 0   calcium carbonate (TUMS - DOSED IN  MG ELEMENTAL CALCIUM) 500 MG chewable tablet Chew 1 tablet by mouth daily as needed for indigestion or heartburn.     Carboxymethylcellul-Glycerin (LUBRICATING EYE DROPS OP) Place 1 drop into both eyes 3 (three) times daily as needed (dry eyes).     Cholecalciferol (VITAMIN D) 50 MCG (2000 UT) tablet Take 2,000 Units by mouth daily.      Cyanocobalamin (VITAMIN B-12 PO) Take 1 tablet by mouth daily.      diclofenac Sodium (VOLTAREN) 1 % GEL Apply 2 g topically 2 (two) times daily as needed (pain). Applied to neck area 2 g 0   diltiazem (CARDIZEM CD) 180 MG 24 hr capsule Take 360 mg by mouth in the morning.     ferrous sulfate 325 (65 FE) MG tablet Take 325 mg by mouth daily with  breakfast.     No current facility-administered medications for this encounter.    Allergies  Allergen Reactions   Flomax [Tamsulosin]     Combined with keflex caused pt to passed out   Keflex [Cephalexin]     In combination with flomax caused pt to pass out    Social History   Socioeconomic History   Marital status: Married    Spouse name: Not on file   Number of children: Not on file   Years of education: Not on file   Highest education level: Not on file  Occupational History   Not on file  Tobacco Use   Smoking status: Former    Packs/day: 1.00    Years: 12.00    Total pack years: 12.00    Types: Cigarettes    Quit date: 8    Years since quitting: 42.5   Smokeless tobacco: Never  Vaping Use   Vaping Use: Never used  Substance and Sexual Activity   Alcohol use: Not Currently    Alcohol/week: 20.0 standard drinks of alcohol    Types: 20 Standard drinks or equivalent per week    Comment: not in a couple of weeks. 06/17/19   Drug use: Never   Sexual activity: Not on file  Other Topics Concern   Not on file  Social History Narrative   Not on file   Social Determinants of Health   Financial Resource Strain: Not on file  Food Insecurity: Not on file  Transportation Needs: No Transportation Needs (06/17/2019)   PRAPARE - Hydrologist (Medical): No    Lack of Transportation (Non-Medical): No  Physical Activity: Not on file  Stress: Not on file  Social Connections: Not on file  Intimate Partner Violence: Not At Risk (06/17/2019)   Humiliation, Afraid, Rape, and Kick questionnaire    Fear of Current or Ex-Partner: No    Emotionally Abused: No    Physically Abused: No    Sexually Abused: No    No family history on file.  ROS- All systems are reviewed and negative except as per the HPI above  Physical Exam: There were no vitals filed for this visit.  Wt Readings from Last 3 Encounters:  06/08/22 65.2 kg  05/25/22 64.3 kg   05/23/22 64 kg    Labs: Lab Results  Component Value Date   NA 137 06/22/2022   K 4.8 06/22/2022   CL 101 06/22/2022   CO2 26 06/22/2022   GLUCOSE 103 (H) 06/22/2022   BUN 14 06/22/2022   CREATININE 0.99 06/22/2022   CALCIUM 10.1 06/22/2022   MG 1.7 05/23/2022   No results found for: "INR"  No results found for: "CHOL", "HDL", "LDLCALC", "TRIG"   GEN- The patient is well appearing, alert and oriented x 3 today.   Head- normocephalic, atraumatic Eyes-  Sclera clear, conjunctiva pink Ears- hearing intact Oropharynx- clear Neck- supple, no JVP Lymph- no cervical lymphadenopathy Lungs- Clear to ausculation bilaterally, normal work of breathing Heart- irregular rate and rhythm, no murmurs, rubs or gallops, PMI not laterally displaced GI- soft, NT, ND, + BS Extremities- no clubbing, cyanosis, or edema MS- no significant deformity or atrophy Skin- no rash or lesion Psych- euthymic mood, full affect Neuro- strength and sensation are intact  EKG- Vent. rate 58 BPM PR interval 160 ms QRS duration 86 ms QT/QTcB 448/439 ms P-R-T axes 66 81 45 Sinus bradycardia with Premature atrial complexes with [place]berant conduction Otherwise normal ECG When compared with ECG of 22-Jun-2022 11:58, PREVIOUS ECG IS PRESENT   Assessment and Plan:  1. New onset afib S/p successful cardioversion Now in SR with bigeminal PAC's  Home sleep study pending  for snoring/ witnessed apnea  Continue 180 diltiazem x 2  in the am, he has been on years for HTN  Echo ordered today  Many PAC's seen on ekg today  One week zio patch   2. CHA2DS2VASc  score of at least 2 Continue eliquis 5 mg bid   3. Htn Stable  Elevated after cardioversion  Went back on quanapril hctz Bmet today   4. Years of snoring/apnea per wife Home sleep study pending    F/u will be per above test results   Butch Penny C. Colvin Blatt, [facility] [address] [place], [place]  [address] [phone]

## 2022-07-26 ENCOUNTER — Ambulatory Visit (HOSPITAL_BASED_OUTPATIENT_CLINIC_OR_DEPARTMENT_OTHER): Payer: Medicare HMO | Admitting: Cardiology

## 2022-07-26 ENCOUNTER — Ambulatory Visit (HOSPITAL_COMMUNITY)
Admission: RE | Admit: 2022-07-26 | Discharge: 2022-07-26 | Disposition: A | Discharge status: Home / Self Care | Payer: Medicare HMO | Source: Ambulatory Visit | Attending: Nurse Practitioner | Admitting: Nurse Practitioner

## 2022-07-26 DIAGNOSIS — I4891 Unspecified atrial fibrillation: Secondary | ICD-10-CM

## 2022-07-26 DIAGNOSIS — I10 Essential (primary) hypertension: Secondary | ICD-10-CM | POA: Diagnosis not present

## 2022-07-26 DIAGNOSIS — G4733 Obstructive sleep apnea (adult) (pediatric): Secondary | ICD-10-CM | POA: Insufficient documentation

## 2022-07-26 DIAGNOSIS — R0683 Snoring: Secondary | ICD-10-CM | POA: Insufficient documentation

## 2022-07-26 DIAGNOSIS — I491 Atrial premature depolarization: Secondary | ICD-10-CM | POA: Diagnosis present

## 2022-07-26 LAB — ECHOCARDIOGRAM COMPLETE
MV M vel: 4.7 m/s
MV Peak grad: 88.4 mmHg
Radius: 0.2 cm
S' Lateral: 2.3 cm

## 2022-07-28 NOTE — Procedures (Signed)
   Patient Name: Miguel Bennett, Miguel Bennett Date: 07/26/2022 Gender: Male D.O.B: 01/08/1951 Age (years): 71 Referring Provider: Sherran Needs Height (inches): 65 Interpreting Physician: Fransico Him MD, ABSM Weight (lbs): 145 RPSGT: [place]er, Holly BMI: 20 MRN: [id number] Neck Size: 17.00  CLINICAL INFORMATION Sleep Study Type: HST  Indication for sleep study: N/A  Epworth Sleepiness Score: 2  SLEEP STUDY TECHNIQUE A multi-channel overnight portable sleep study was performed. The channels recorded were: nasal airflow, thoracic respiratory movement, and oxygen saturation with a pulse oximetry. Snoring was also monitored.  MEDICATIONS Patient self administered medications include: N/A.  SLEEP ARCHITECTURE Patient was studied for 402.5 minutes. The sleep efficiency was 100.0 % and the patient was supine for 0%. The arousal index was 0.0 per hour.  RESPIRATORY PARAMETERS The overall AHI was 13.1 per hour, with a central apnea index of 0 per hour.  The oxygen nadir was 89% during sleep.  CARDIAC DATA Mean heart rate during sleep was 61.1 bpm.  IMPRESSIONS - Mild obstructive sleep apnea occurred during this study (AHI = 13.1/h). - Mild oxygen desaturation was noted during this study (Min O2 = 89%). - Patient snored 9.3% during the sleep.  DIAGNOSIS - Obstructive Sleep Apnea (G47.33)  RECOMMENDATIONS - Therapeutic CPAP titration to determine optimal pressure required to alleviate sleep disordered breathing. - Positional therapy avoiding supine position during sleep. - Avoid alcohol, sedatives and other CNS depressants that may worsen sleep apnea and disrupt normal sleep architecture. - Sleep hygiene should be reviewed to assess factors that may improve sleep quality. - Weight management and regular exercise should be initiated or continued.  [Electronically signed] 07/28/2022 08:34 AM  Fransico Him MD, ABSM Diplomate, American Board of Sleep Medicine

## 2022-08-01 ENCOUNTER — Other Ambulatory Visit (HOSPITAL_COMMUNITY): Payer: Self-pay

## 2022-08-01 DIAGNOSIS — I4891 Unspecified atrial fibrillation: Secondary | ICD-10-CM

## 2022-08-01 MED ORDER — APIXABAN 5 MG PO TABS: 5.0000 mg | ORAL_TABLET | Freq: Two times a day (BID) | ORAL | 3 refills | Status: DC

## 2022-08-03 ENCOUNTER — Telehealth: Payer: Self-pay | Admitting: *Deleted

## 2022-08-03 DIAGNOSIS — G4733 Obstructive sleep apnea (adult) (pediatric): Secondary | ICD-10-CM

## 2022-08-03 NOTE — Telephone Encounter (Signed)
The patient has been notified of the result and verbalized understanding.  All questions (if any) were answered. Miguel Bennett, [place] 08/03/2022 9:50 AM    Patient has declined more testing and says he will speak with his provider at his next appointment and get back with [name].

## 2022-08-03 NOTE — Telephone Encounter (Signed)
-----   Message from Lauralee Evener, [place] sent at 07/31/2022  8:16 AM EDT -----  ----- Message ----- From: Sueanne Margarita, MD Sent: 07/28/2022   8:36 AM EDT To: Cv Div Sleep Studies  Please let patient know that they have sleep apnea.  Recommend therapeutic CPAP titration for treatment of patient's sleep disordered breathing.  If unable to perform an in lab titration then initiate ResMed auto CPAP from 4 to 15cm H2O with heated humidity and mask of choice and overnight pulse ox on CPAP.

## 2022-08-20 NOTE — H&P (View-Only) (Signed)
Cardiology Office Note:    Date:  08/21/2022   ID:  [name], DOB Mar 24, 1951, MRN [id number]  PCP:  [name], MD  Cardiologist:  None  Electrophysiologist:  None   Referring MD: Sherran Needs, NP   Chief Complaint  Patient presents with   Atrial Fibrillation    History of Present Illness:    DALVIN CLIPPER is a 71 y.o. male with a hx of atrial fibrillation, hypertension, PUD, throat cancer who is referred by [name], NP for evaluation of pulmonary hypertension.  Diagnosed with A-fib in June 2023.  Started on diltiazem and Eliquis 5 mg twice daily.  Underwent successful cardioversion 06/22/2022.  Echocardiogram 07/26/2022 showed normal biventricular function, moderately enlarged RV, moderately elevated RVSP, interventricular septal flattening in systole and diastole, mild biatrial enlargement, moderate to severe TR.  Zio patch x7 days on 07/19/2022 showed no atrial fibrillation, frequent PACs (20% of beats), occasional PVCs (2% of beats).  He reports dyspnea with exertion.  Denies any chest pain, lightheadedness, syncope, lower extremity edema, palpitations.  Denies any bleeding on Eliquis.  He smoked 1 pack/day x 12 years, quit over 40 years ago.  Family history includes father had MI in 7s to 97s.  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    Past Surgical History:  Procedure Laterality Date   CARDIOVERSION N/A 06/22/2022   Procedure: CARDIOVERSION;  Surgeon: [name], MD;  Location: [place];  Service: Cardiovascular;  Laterality: N/A;   CATARACT EXTRACTION W/ INTRAOCULAR LENS  IMPLANT, BILATERAL     CHOLECYSTECTOMY     COLONOSCOPY     COLONOSCOPY WITH PROPOFOL N/A 11/08/2021   Procedure:  COLONOSCOPY WITH PROPOFOL;  Surgeon: [name], MD;  [facility];  Service: Endoscopy;  Laterality: N/A;   DIRECT LARYNGOSCOPY Right 03/18/2019   Procedure: DIRECT LARYNGOSCOPY WITH BIOPSY OF RIGHT TONSILLAR MASS;  Surgeon: [name], MD;  Location: [place];  Service: ENT;  Laterality: Right;   NECK SURGERY     at [facility] to get cancer out of neck area from tonsils   POLYPECTOMY     colon   POLYPECTOMY  11/08/2021   Procedure: POLYPECTOMY;  Surgeon: [name], MD;  [facility];  Service: Endoscopy;;   STOMACH SURGERY  1984   40 % of stomach removed due to a bleeding ulcer   TONSILLECTOMY      Current Medications: Current Meds  Medication Sig   apixaban (ELIQUIS) 5 MG TABS tablet Take 1 tablet (5 mg total) by mouth 2 (two) times daily.   calcium carbonate (TUMS - DOSED IN MG ELEMENTAL CALCIUM) 500 MG chewable tablet Chew 1 tablet by mouth daily as needed for indigestion or heartburn.   Carboxymethylcellul-Glycerin (LUBRICATING EYE DROPS OP) Place 1 drop into both eyes 3 (three) times daily as needed (dry eyes).   Cholecalciferol (VITAMIN D) 50 MCG (2000 UT) tablet Take 2,000 Units by mouth daily.    Cyanocobalamin (VITAMIN B-12 PO) Take 1 tablet by mouth daily.    diclofenac Sodium (VOLTAREN) 1 % GEL Apply 2 g topically 2 (two) times daily as needed (pain). Applied to neck area   diltiazem (CARDIZEM  CD) 180 MG 24 hr capsule Take 360 mg by mouth in the morning.   ferrous sulfate 325 (65 FE) MG tablet Take 325 mg by mouth daily with breakfast.   lisinopril-hydrochlorothiazide (ZESTORETIC) 20-12.5 MG tablet Take 0.5 tablets by mouth daily.   [DISCONTINUED] quinapril-hydrochlorothiazide (ACCURETIC) 20-12.5 MG tablet Take 0.5 tablets by mouth daily.     Allergies:   Flomax [tamsulosin] and Keflex [cephalexin]   Social History   Socioeconomic History   Marital status: Married    Spouse name: Not on file   Number of children: Not on file   Years of education:  Not on file   Highest education level: Not on file  Occupational History   Not on file  Tobacco Use   Smoking status: Former    Packs/day: 1.00    Years: 12.00    Total pack years: 12.00    Types: Cigarettes    Quit date: 43    Years since quitting: 42.7   Smokeless tobacco: Never  Vaping Use   Vaping Use: Never used  Substance and Sexual Activity   Alcohol use: Not Currently    Alcohol/week: 20.0 standard drinks of alcohol    Types: 20 Standard drinks or equivalent per week    Comment: not in a couple of weeks. 06/17/19   Drug use: Never   Sexual activity: Not on file  Other Topics Concern   Not on file  Social History Narrative   Not on file   Social Determinants of Health   Financial Resource Strain: Not on file  Food Insecurity: Not on file  Transportation Needs: No Transportation Needs (06/17/2019)   PRAPARE - Hydrologist (Medical): No    Lack of Transportation (Non-Medical): No  Physical Activity: Not on file  Stress: Not on file  Social Connections: Not on file     Family History: Family history includes father had MI in 93s to 31s.  ROS:   Please see the history of present illness.     All other systems reviewed and are negative.  EKGs/Labs/Other Studies Reviewed:    The following studies were reviewed today:   EKG:   08/21/2022: Normal sinus rhythm, frequent PACs, rate 53  Recent Labs: 05/23/2022: Magnesium 1.7; TSH 0.927 06/22/2022: Hemoglobin 15.6; Platelets 271 07/06/2022: BUN 18; Creatinine, Ser 1.14; Potassium 4.8; Sodium 134  Recent Lipid Panel No results found for: "CHOL", "TRIG", "HDL", "CHOLHDL", "VLDL", "LDLCALC", "LDLDIRECT"  Physical Exam:    VS:  BP 124/68   Pulse (!) 53   Ht 6' (1.829 m)   Wt 147 lb (66.7 kg)   SpO2 99%   BMI 19.94 kg/m     Wt Readings from Last 3 Encounters:  08/21/22 147 lb (66.7 kg)  07/26/22 145 lb (65.8 kg)  07/06/22 145 lb 9.6 oz (66 kg)     GEN:  Well nourished, well  developed in no acute distress HEENT: Normal NECK: No JVD; No carotid bruits LYMPHATICS: No lymphadenopathy CARDIAC: RRR, no murmurs, rubs, gallops RESPIRATORY:  Clear to auscultation without rales, wheezing or rhonchi  ABDOMEN: Soft, non-tender, non-distended MUSCULOSKELETAL:  No edema; No deformity  SKIN: Warm and dry NEUROLOGIC:  Alert and oriented x 3 PSYCHIATRIC:  Normal affect   ASSESSMENT:    1. Pulmonary hypertension ([place])   2. Atrial fibrillation, unspecified type ([place])   3. Pre-procedure lab exam   4. Essential hypertension    PLAN:    Pulmonary hypertension: Moderate pulmonary hypertension on echocardiogram 07/26/2022.  Echo showed moderately dilated RV with interventricular septal flattening in systole and diastole, moderate to severe TR. appears euvolemic on exam -Recommend RHC for further evaluation of pulmonary hypertension.  Also plan for LHC to evaluate coronaries as having dyspnea on exertion that could represent anginal equivalent and does have risk factors for CAD (age, hypertension, family history).  Risks and benefits of cardiac catheterization have been discussed with the patient.  These include bleeding, infection, kidney damage, stroke, heart attack, death.  The patient understands these risks and is willing to proceed.  Persistent atrial fibrillation: Diagnosed with A-fib in June 2023.  Underwent successful cardioversion 06/22/2022.  CHA2DS2-VASc score 2 (hypertension, age).  Echocardiogram 07/26/2022 showed normal biventricular function, moderately enlarged RV, moderately elevated RVSP, interventricular septal flattening in systole and diastole, mild biatrial enlargement, moderate to severe TR.  Zio patch x7 days on 07/19/2022 showed no atrial fibrillation, frequent PACs (20% of beats), occasional PVCs (2% of beats). -Continue diltiazem -Continue Eliquis 5 mg twice daily -Sleep study showed OSA, planning to start CPAP  Hypertension: On diltiazem 360 mg daily and  quinapril-HCTZ 10-6.25 mg daily.  Appears controlled  OSA: Recent sleep study positive, planning to start CPAP  RTC in 1 month     Medication Adjustments/Labs and Tests Ordered: Current medicines are reviewed at length with the patient today.  Concerns regarding medicines are outlined above.  Orders Placed This Encounter  Procedures   Comprehensive metabolic panel   CBC   Brain natriuretic peptide   ANA   EKG 12-Lead   No orders of the defined types were placed in this encounter.   Patient Instructions   [place] A DEPT OF [place] A DEPT OF [place]. [facility] [place] [id number] [place] [address] Dept: [phone] Loc: [place]  08/21/2022  You are scheduled for a Cardiac Catheterization on Friday, September 15th with Dr. Daneen Schick.  1. Please arrive at the [facility] (Main Entrance A) at [facility]: [address] [place], [place] [address] at 8:30 AM (This time is two hours before your procedure to ensure your preparation). Free valet parking service is available.   Special note: Every effort is made to have your procedure done on time. Please understand that emergencies sometimes delay scheduled procedures.  2. Diet: Do not eat solid foods after midnight.  The patient may have clear liquids until 5am upon the day of the procedure.  3. Labs: today in office   4. Medication instructions in preparation for your procedure:   Contrast Allergy: No  HOLD Eliquis for 2 days prior  Hold lisinopril/HCTZ morning of procedure  On the morning of your procedure, take your Aspirin and any morning medicines NOT listed above.  You may use sips of water.  5. Plan for one night stay--bring personal belongings. 6. Bring a current list of your medications and current insurance cards. 7. You MUST have a responsible person to drive you home. 8.  Someone MUST be with you the first 24 hours after you arrive home or your discharge will be delayed. 9. Please wear clothes that are easy to get on and off and wear slip-on shoes.  Thank you for allowing [name] to care for you!   -- [facility] Invasive Cardiovascular services    Signed, [name], MD  08/21/2022 11:30 PM    [place]

## 2022-08-20 NOTE — Progress Notes (Unsigned)
Cardiology Office Note:    Date:  08/21/2022   ID:  Miguel Bennett, DOB May 27, 1951, MRN [id number]  PCP:  [name], MD  Cardiologist:  None  Electrophysiologist:  None   Referring MD: Sherran Needs, NP   Chief Complaint  Patient presents with   Atrial Fibrillation    History of Present Illness:    Miguel Bennett is a 71 y.o. male with a hx of atrial fibrillation, hypertension, PUD, throat cancer who is referred by [name], NP for evaluation of pulmonary hypertension.  Diagnosed with A-fib in June 2023.  Started on diltiazem and Eliquis 5 mg twice daily.  Underwent successful cardioversion 06/22/2022.  Echocardiogram 07/26/2022 showed normal biventricular function, moderately enlarged RV, moderately elevated RVSP, interventricular septal flattening in systole and diastole, mild biatrial enlargement, moderate to severe TR.  Zio patch x7 days on 07/19/2022 showed no atrial fibrillation, frequent PACs (20% of beats), occasional PVCs (2% of beats).  He reports dyspnea with exertion.  Denies any chest pain, lightheadedness, syncope, lower extremity edema, palpitations.  Denies any bleeding on Eliquis.  He smoked 1 pack/day x 12 years, quit over 40 years ago.  Family history includes father had MI in 21s to 59s.  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    Past Surgical History:  Procedure Laterality Date   CARDIOVERSION N/A 06/22/2022   Procedure: CARDIOVERSION;  Surgeon: [name], MD;  Location: [place];  Service: Cardiovascular;  Laterality: N/A;   CATARACT EXTRACTION W/ INTRAOCULAR LENS  IMPLANT, BILATERAL     CHOLECYSTECTOMY     COLONOSCOPY     COLONOSCOPY WITH PROPOFOL N/A 11/08/2021   Procedure:  COLONOSCOPY WITH PROPOFOL;  Surgeon: [name], MD;  [facility];  Service: Endoscopy;  Laterality: N/A;   DIRECT LARYNGOSCOPY Right 03/18/2019   Procedure: DIRECT LARYNGOSCOPY WITH BIOPSY OF RIGHT TONSILLAR MASS;  Surgeon: [name], MD;  Location: [place];  Service: ENT;  Laterality: Right;   NECK SURGERY     at [facility] to get cancer out of neck area from tonsils   POLYPECTOMY     colon   POLYPECTOMY  11/08/2021   Procedure: POLYPECTOMY;  Surgeon: [name], MD;  [facility];  Service: Endoscopy;;   STOMACH SURGERY  1984   40 % of stomach removed due to a bleeding ulcer   TONSILLECTOMY      Current Medications: Current Meds  Medication Sig   apixaban (ELIQUIS) 5 MG TABS tablet Take 1 tablet (5 mg total) by mouth 2 (two) times daily.   calcium carbonate (TUMS - DOSED IN MG ELEMENTAL CALCIUM) 500 MG chewable tablet Chew 1 tablet by mouth daily as needed for indigestion or heartburn.   Carboxymethylcellul-Glycerin (LUBRICATING EYE DROPS OP) Place 1 drop into both eyes 3 (three) times daily as needed (dry eyes).   Cholecalciferol (VITAMIN D) 50 MCG (2000 UT) tablet Take 2,000 Units by mouth daily.    Cyanocobalamin (VITAMIN B-12 PO) Take 1 tablet by mouth daily.    diclofenac Sodium (VOLTAREN) 1 % GEL Apply 2 g topically 2 (two) times daily as needed (pain). Applied to neck area   diltiazem (CARDIZEM  CD) 180 MG 24 hr capsule Take 360 mg by mouth in the morning.   ferrous sulfate 325 (65 FE) MG tablet Take 325 mg by mouth daily with breakfast.   lisinopril-hydrochlorothiazide (ZESTORETIC) 20-12.5 MG tablet Take 0.5 tablets by mouth daily.   [DISCONTINUED] quinapril-hydrochlorothiazide (ACCURETIC) 20-12.5 MG tablet Take 0.5 tablets by mouth daily.     Allergies:   Flomax [tamsulosin] and Keflex [cephalexin]   Social History   Socioeconomic History   Marital status: Married    Spouse name: Not on file   Number of children: Not on file   Years of education:  Not on file   Highest education level: Not on file  Occupational History   Not on file  Tobacco Use   Smoking status: Former    Packs/day: 1.00    Years: 12.00    Total pack years: 12.00    Types: Cigarettes    Quit date: 80    Years since quitting: 42.7   Smokeless tobacco: Never  Vaping Use   Vaping Use: Never used  Substance and Sexual Activity   Alcohol use: Not Currently    Alcohol/week: 20.0 standard drinks of alcohol    Types: 20 Standard drinks or equivalent per week    Comment: not in a couple of weeks. 06/17/19   Drug use: Never   Sexual activity: Not on file  Other Topics Concern   Not on file  Social History Narrative   Not on file   Social Determinants of Health   Financial Resource Strain: Not on file  Food Insecurity: Not on file  Transportation Needs: No Transportation Needs (06/17/2019)   PRAPARE - Hydrologist (Medical): No    Lack of Transportation (Non-Medical): No  Physical Activity: Not on file  Stress: Not on file  Social Connections: Not on file     Family History: Family history includes father had MI in 70s to 71s.  ROS:   Please see the history of present illness.     All other systems reviewed and are negative.  EKGs/Labs/Other Studies Reviewed:    The following studies were reviewed today:   EKG:   08/21/2022: Normal sinus rhythm, frequent PACs, rate 53  Recent Labs: 05/23/2022: Magnesium 1.7; TSH 0.927 06/22/2022: Hemoglobin 15.6; Platelets 271 07/06/2022: BUN 18; Creatinine, Ser 1.14; Potassium 4.8; Sodium 134  Recent Lipid Panel No results found for: "CHOL", "TRIG", "HDL", "CHOLHDL", "VLDL", "LDLCALC", "LDLDIRECT"  Physical Exam:    VS:  BP 124/68   Pulse (!) 53   Ht 6' (1.829 m)   Wt 147 lb (66.7 kg)   SpO2 99%   BMI 19.94 kg/m     Wt Readings from Last 3 Encounters:  08/21/22 147 lb (66.7 kg)  07/26/22 145 lb (65.8 kg)  07/06/22 145 lb 9.6 oz (66 kg)     GEN:  Well nourished, well  developed in no acute distress HEENT: Normal NECK: No JVD; No carotid bruits LYMPHATICS: No lymphadenopathy CARDIAC: RRR, no murmurs, rubs, gallops RESPIRATORY:  Clear to auscultation without rales, wheezing or rhonchi  ABDOMEN: Soft, non-tender, non-distended MUSCULOSKELETAL:  No edema; No deformity  SKIN: Warm and dry NEUROLOGIC:  Alert and oriented x 3 PSYCHIATRIC:  Normal affect   ASSESSMENT:    1. Pulmonary hypertension ([place])   2. Atrial fibrillation, unspecified type ([place])   3. Pre-procedure lab exam   4. Essential hypertension    PLAN:    Pulmonary hypertension: Moderate pulmonary hypertension on echocardiogram 07/26/2022.  Echo showed moderately dilated RV with interventricular septal flattening in systole and diastole, moderate to severe TR. appears euvolemic on exam -Recommend RHC for further evaluation of pulmonary hypertension.  Also plan for LHC to evaluate coronaries as having dyspnea on exertion that could represent anginal equivalent and does have risk factors for CAD (age, hypertension, family history).  Risks and benefits of cardiac catheterization have been discussed with the patient.  These include bleeding, infection, kidney damage, stroke, heart attack, death.  The patient understands these risks and is willing to proceed.  Persistent atrial fibrillation: Diagnosed with A-fib in June 2023.  Underwent successful cardioversion 06/22/2022.  CHA2DS2-VASc score 2 (hypertension, age).  Echocardiogram 07/26/2022 showed normal biventricular function, moderately enlarged RV, moderately elevated RVSP, interventricular septal flattening in systole and diastole, mild biatrial enlargement, moderate to severe TR.  Zio patch x7 days on 07/19/2022 showed no atrial fibrillation, frequent PACs (20% of beats), occasional PVCs (2% of beats). -Continue diltiazem -Continue Eliquis 5 mg twice daily -Sleep study showed OSA, planning to start CPAP  Hypertension: On diltiazem 360 mg daily and  quinapril-HCTZ 10-6.25 mg daily.  Appears controlled  OSA: Recent sleep study positive, planning to start CPAP  RTC in 1 month     Medication Adjustments/Labs and Tests Ordered: Current medicines are reviewed at length with the patient today.  Concerns regarding medicines are outlined above.  Orders Placed This Encounter  Procedures   Comprehensive metabolic panel   CBC   Brain natriuretic peptide   ANA   EKG 12-Lead   No orders of the defined types were placed in this encounter.   Patient Instructions   [place] A DEPT OF [place] A DEPT OF [place]. [facility] [place] [id number] [place] [address] Dept: [phone] Loc: [place]  08/21/2022  You are scheduled for a Cardiac Catheterization on Friday, September 15th with Dr. Daneen Schick.  1. Please arrive at the [facility] (Main Entrance A) at [facility]: [address] [place], [place] [address] at 8:30 AM (This time is two hours before your procedure to ensure your preparation). Free valet parking service is available.   Special note: Every effort is made to have your procedure done on time. Please understand that emergencies sometimes delay scheduled procedures.  2. Diet: Do not eat solid foods after midnight.  The patient may have clear liquids until 5am upon the day of the procedure.  3. Labs: today in office   4. Medication instructions in preparation for your procedure:   Contrast Allergy: No  HOLD Eliquis for 2 days prior  Hold lisinopril/HCTZ morning of procedure  On the morning of your procedure, take your Aspirin and any morning medicines NOT listed above.  You may use sips of water.  5. Plan for one night stay--bring personal belongings. 6. Bring a current list of your medications and current insurance cards. 7. You MUST have a responsible person to drive you home. 8.  Someone MUST be with you the first 24 hours after you arrive home or your discharge will be delayed. 9. Please wear clothes that are easy to get on and off and wear slip-on shoes.  Thank you for allowing [name] to care for you!   -- [facility] Health Invasive Cardiovascular services    Signed, [name], MD  08/21/2022 11:30 PM    [place]

## 2022-08-21 ENCOUNTER — Encounter: Payer: Self-pay | Admitting: Cardiology

## 2022-08-21 ENCOUNTER — Ambulatory Visit: Payer: Medicare HMO | Attending: Cardiology | Admitting: Cardiology

## 2022-08-21 VITALS — BP 124/68 | HR 53 | Ht 72.0 in | Wt 147.0 lb

## 2022-08-21 DIAGNOSIS — Z01812 Encounter for preprocedural laboratory examination: Secondary | ICD-10-CM | POA: Diagnosis not present

## 2022-08-21 DIAGNOSIS — I10 Essential (primary) hypertension: Secondary | ICD-10-CM | POA: Diagnosis not present

## 2022-08-21 DIAGNOSIS — I272 Pulmonary hypertension, unspecified: Secondary | ICD-10-CM | POA: Diagnosis not present

## 2022-08-21 DIAGNOSIS — I4891 Unspecified atrial fibrillation: Secondary | ICD-10-CM | POA: Diagnosis not present

## 2022-08-21 NOTE — Patient Instructions (Signed)
  [place] A DEPT OF [place] A DEPT OF [place]. [facility] [place] [id number] [place] [address] Dept: [phone] Loc: [place]  08/21/2022  You are scheduled for a Cardiac Catheterization on Friday, September 15th with Dr. Daneen Schick.  1. Please arrive at the [facility] (Main Entrance A) at [facility]: [address] [place], [place] [address] at 8:30 AM (This time is two hours before your procedure to ensure your preparation). Free valet parking service is available.   Special note: Every effort is made to have your procedure done on time. Please understand that emergencies sometimes delay scheduled procedures.  2. Diet: Do not eat solid foods after midnight.  The patient may have clear liquids until 5am upon the day of the procedure.  3. Labs: today in office   4. Medication instructions in preparation for your procedure:   Contrast Allergy: No  HOLD Eliquis for 2 days prior  Hold lisinopril/HCTZ morning of procedure  On the morning of your procedure, take your Aspirin and any morning medicines NOT listed above.  You may use sips of water.  5. Plan for one night stay--bring personal belongings. 6. Bring a current list of your medications and current insurance cards. 7. You MUST have a responsible person to drive you home. 8. Someone MUST be with you the first 24 hours after you arrive home or your discharge will be delayed. 9. Please wear clothes that are easy to get on and off and wear slip-on shoes.  Thank you for allowing [name] to care for you!   -- [place] Invasive Cardiovascular services

## 2022-08-23 ENCOUNTER — Telehealth: Payer: Self-pay | Admitting: Cardiology

## 2022-08-23 NOTE — Telephone Encounter (Signed)
Spoke to patient's wife she stated husband is having a cardiac cath this Fri 9/15.She wanted to clarify when he is to hold Eliquis.Advised to hold 2 days before, start holding today.

## 2022-08-23 NOTE — Telephone Encounter (Signed)
Pt c/o medication issue:  1. Name of Medication: apixaban (ELIQUIS) 5 MG TABS tablet  2. How are you currently taking this medication (dosage and times per day)? As prescribed   3. Are you having a reaction (difficulty breathing--STAT)? No  4. What is your medication issue? Pt needs to know if this medication dose before procedure was suppose to be last night 09/12 and that he shouldn't take a dose today. Requesting call back rather fast due to being 2 hrs past his normal time to take. Please advise.

## 2022-08-24 ENCOUNTER — Telehealth: Payer: Self-pay | Admitting: Cardiology

## 2022-08-24 ENCOUNTER — Encounter: Payer: Self-pay | Admitting: Cardiology

## 2022-08-24 ENCOUNTER — Telehealth: Payer: Self-pay | Admitting: *Deleted

## 2022-08-24 LAB — CBC
Hematocrit: 41.9 % (ref 37.5–51.0)
Hemoglobin: 14.6 g/dL (ref 13.0–17.7)
MCH: 33.7 pg — ABNORMAL HIGH (ref 26.6–33.0)
MCHC: 34.8 g/dL (ref 31.5–35.7)
MCV: 97 fL (ref 79–97)
Platelets: 287 10*3/uL (ref 150–450)
RBC: 4.33 x10E6/uL (ref 4.14–5.80)
RDW: 12.6 % (ref 11.6–15.4)
WBC: 6.6 10*3/uL (ref 3.4–10.8)

## 2022-08-24 LAB — COMPREHENSIVE METABOLIC PANEL
ALT: 10 IU/L (ref 0–44)
AST: 16 IU/L (ref 0–40)
Albumin/Globulin Ratio: 1.7 (ref 1.2–2.2)
Albumin: 4.7 g/dL (ref 3.8–4.8)
Alkaline Phosphatase: 72 IU/L (ref 44–121)
BUN/Creatinine Ratio: 19 (ref 10–24)
BUN: 19 mg/dL (ref 8–27)
Bilirubin Total: 0.6 mg/dL (ref 0.0–1.2)
CO2: 25 mmol/L (ref 20–29)
Calcium: 10.5 mg/dL — ABNORMAL HIGH (ref 8.6–10.2)
Chloride: 94 mmol/L — ABNORMAL LOW (ref 96–106)
Creatinine, Ser: 0.99 mg/dL (ref 0.76–1.27)
Globulin, Total: 2.7 g/dL (ref 1.5–4.5)
Glucose: 93 mg/dL (ref 70–99)
Potassium: 5 mmol/L (ref 3.5–5.2)
Sodium: 134 mmol/L (ref 134–144)
Total Protein: 7.4 g/dL (ref 6.0–8.5)
eGFR: 81 mL/min/{1.73_m2} (ref >59)

## 2022-08-24 LAB — BRAIN NATRIURETIC PEPTIDE: BNP: 58.4 pg/mL (ref 0.0–100.0)

## 2022-08-24 LAB — ANA: ANA Titer 1: NEGATIVE

## 2022-08-24 NOTE — Telephone Encounter (Signed)
Wife called stating in the patient's MyChart it shows patient's lab work has not been read and the patient is concerned if everything is OK to go forward with the procedure tomorrow.

## 2022-08-24 NOTE — Telephone Encounter (Signed)
Left message to return call 

## 2022-08-24 NOTE — Telephone Encounter (Signed)
Cardiac Catheterization scheduled at [facility] for: Friday August 25, 2022 10:30 AM Arrival time and place: [place] Entrance A at: 8:30 AM  Nothing to eat after midnight prior to procedure, clear liquids until 5 AM day of procedure.   Medication instructions: -Hold:  Eliquis-none 08/23/22 until post procedure  Lisinopril/HCT-AM of procedure -Except hold medications usual  morning medications can be taken with sips of water including aspirin 81 mg.  Confirmed patient has responsible adult to drive home post procedure and be with patient first 24 hours after arriving home.  Patient reports no new symptoms concerning for COVID-19 in the past 10 days.  Reviewed procedure instructions with patient.    Patient is aware Dr Gardiner Rhyme has reviewed 08/21/22 lab results and per Dr Gardiner Rhyme: " No significant abnormalities"

## 2022-08-24 NOTE — H&P (Signed)
PAF Referred for L and R heart cath to evaluate DOE and moderate pulmonary hypertension.

## 2022-08-24 NOTE — Telephone Encounter (Signed)
This is not a preop question - patient is scheduled for heart cath so more of a triage question. Will route to triage to let them know that ordering provider will review labs in full but on my review do not see any acute findings that would preclude going forward as Dr. Gardiner Rhyme requested. He will relay final recommendations when he results the labs

## 2022-08-25 ENCOUNTER — Ambulatory Visit (HOSPITAL_COMMUNITY)
Admission: RE | Admit: 2022-08-25 | Discharge: 2022-08-25 | Disposition: A | Discharge status: Home / Self Care | Payer: Medicare HMO | Attending: Interventional Cardiology | Admitting: Interventional Cardiology

## 2022-08-25 ENCOUNTER — Encounter (HOSPITAL_COMMUNITY)
Admission: RE | Disposition: A | Discharge status: Home / Self Care | Payer: Self-pay | Source: Home / Self Care | Attending: Interventional Cardiology

## 2022-08-25 ENCOUNTER — Other Ambulatory Visit: Payer: Self-pay

## 2022-08-25 DIAGNOSIS — Z87891 Personal history of nicotine dependence: Secondary | ICD-10-CM | POA: Insufficient documentation

## 2022-08-25 DIAGNOSIS — R0609 Other forms of dyspnea: Secondary | ICD-10-CM | POA: Diagnosis not present

## 2022-08-25 DIAGNOSIS — I10 Essential (primary) hypertension: Secondary | ICD-10-CM | POA: Insufficient documentation

## 2022-08-25 DIAGNOSIS — I4891 Unspecified atrial fibrillation: Secondary | ICD-10-CM | POA: Diagnosis present

## 2022-08-25 DIAGNOSIS — G4733 Obstructive sleep apnea (adult) (pediatric): Secondary | ICD-10-CM | POA: Diagnosis not present

## 2022-08-25 DIAGNOSIS — I2510 Atherosclerotic heart disease of native coronary artery without angina pectoris: Secondary | ICD-10-CM

## 2022-08-25 DIAGNOSIS — Z7901 Long term (current) use of anticoagulants: Secondary | ICD-10-CM | POA: Diagnosis not present

## 2022-08-25 DIAGNOSIS — I4819 Other persistent atrial fibrillation: Secondary | ICD-10-CM | POA: Insufficient documentation

## 2022-08-25 DIAGNOSIS — Z79899 Other long term (current) drug therapy: Secondary | ICD-10-CM | POA: Insufficient documentation

## 2022-08-25 DIAGNOSIS — I272 Pulmonary hypertension, unspecified: Secondary | ICD-10-CM | POA: Diagnosis present

## 2022-08-25 HISTORY — PX: RIGHT/LEFT HEART CATH AND CORONARY ANGIOGRAPHY: CATH118266

## 2022-08-25 LAB — POCT I-STAT 7, (LYTES, BLD GAS, ICA,H+H)
Acid-base deficit: 3 mmol/L — ABNORMAL HIGH (ref 0.0–2.0)
Bicarbonate: 22.2 mmol/L (ref 20.0–28.0)
Calcium, Ion: 1.3 mmol/L (ref 1.15–1.40)
HCT: 42 % (ref 39.0–52.0)
Hemoglobin: 14.3 g/dL (ref 13.0–17.0)
O2 Saturation: 98 %
Potassium: 3.7 mmol/L (ref 3.5–5.1)
Sodium: 129 mmol/L — ABNORMAL LOW (ref 135–145)
TCO2: 23 mmol/L (ref 22–32)
pCO2 arterial: 40.2 mmHg (ref 32–48)
pH, Arterial: 7.35 (ref 7.35–7.45)
pO2, Arterial: 102 mmHg (ref 83–108)

## 2022-08-25 LAB — POCT I-STAT EG7
Acid-Base Excess: 0 mmol/L (ref 0.0–2.0)
Acid-base deficit: 1 mmol/L (ref 0.0–2.0)
Bicarbonate: 24.9 mmol/L (ref 20.0–28.0)
Bicarbonate: 25.2 mmol/L (ref 20.0–28.0)
Calcium, Ion: 1.36 mmol/L (ref 1.15–1.40)
Calcium, Ion: 1.34 mmol/L (ref 1.15–1.40)
HCT: 43 % (ref 39.0–52.0)
HCT: 43 % (ref 39.0–52.0)
Hemoglobin: 14.6 g/dL (ref 13.0–17.0)
Hemoglobin: 14.6 g/dL (ref 13.0–17.0)
O2 Saturation: 75 %
O2 Saturation: 75 %
Potassium: 3.9 mmol/L (ref 3.5–5.1)
Potassium: 3.9 mmol/L (ref 3.5–5.1)
Sodium: 135 mmol/L (ref 135–145)
Sodium: 135 mmol/L (ref 135–145)
TCO2: 26 mmol/L (ref 22–32)
TCO2: 27 mmol/L (ref 22–32)
pCO2, Ven: 43.7 mmHg — ABNORMAL LOW (ref 44–60)
pCO2, Ven: 43.6 mmHg — ABNORMAL LOW (ref 44–60)
pH, Ven: 7.363 (ref 7.25–7.43)
pH, Ven: 7.37 (ref 7.25–7.43)
pO2, Ven: 41 mmHg (ref 32–45)
pO2, Ven: 41 mmHg (ref 32–45)

## 2022-08-25 SURGERY — RIGHT/LEFT HEART CATH AND CORONARY ANGIOGRAPHY
Anesthesia: LOCAL

## 2022-08-25 MED ORDER — SODIUM CHLORIDE 0.9% FLUSH: 3.0000 mL | INTRAVENOUS | Status: DC | PRN

## 2022-08-25 MED ORDER — ASPIRIN 81 MG PO CHEW
81.0000 mg | CHEWABLE_TABLET | ORAL | Status: AC
  Administered 2022-08-25: 81 mg via ORAL
  Filled 2022-08-25: qty 1

## 2022-08-25 MED ORDER — VERAPAMIL HCL 2.5 MG/ML IV SOLN
INTRAVENOUS | Status: DC | PRN
  Administered 2022-08-25: 10 mL via INTRA_ARTERIAL

## 2022-08-25 MED ORDER — ACETAMINOPHEN 325 MG PO TABS: 650.0000 mg | ORAL_TABLET | ORAL | Status: DC | PRN

## 2022-08-25 MED ORDER — SODIUM CHLORIDE 0.9 % IV SOLN: 250.0000 mL | INTRAVENOUS | Status: DC | PRN

## 2022-08-25 MED ORDER — HEPARIN (PORCINE) IN NACL 1000-0.9 UT/500ML-% IV SOLN
INTRAVENOUS | Status: DC | PRN
  Administered 2022-08-25 (×2): 500 mL

## 2022-08-25 MED ORDER — OXYCODONE HCL 5 MG PO TABS: 5.0000 mg | ORAL_TABLET | ORAL | Status: DC | PRN

## 2022-08-25 MED ORDER — FENTANYL CITRATE (PF) 100 MCG/2ML IJ SOLN
INTRAMUSCULAR | Status: DC | PRN
  Administered 2022-08-25 (×2): 25 ug via INTRAVENOUS

## 2022-08-25 MED ORDER — ONDANSETRON HCL 4 MG/2ML IJ SOLN: 4.0000 mg | Freq: Four times a day (QID) | INTRAMUSCULAR | Status: DC | PRN

## 2022-08-25 MED ORDER — LIDOCAINE HCL (PF) 1 % IJ SOLN
INTRAMUSCULAR | Status: DC | PRN
  Administered 2022-08-25: 5 mL

## 2022-08-25 MED ORDER — APIXABAN 5 MG PO TABS: 5.0000 mg | ORAL_TABLET | Freq: Two times a day (BID) | ORAL | 3 refills | Status: DC

## 2022-08-25 MED ORDER — HEPARIN SODIUM (PORCINE) 1000 UNIT/ML IJ SOLN
INTRAMUSCULAR | Status: AC
  Filled 2022-08-25: qty 10

## 2022-08-25 MED ORDER — SODIUM CHLORIDE 0.9% FLUSH: 3.0000 mL | Freq: Two times a day (BID) | INTRAVENOUS | Status: DC

## 2022-08-25 MED ORDER — MIDAZOLAM HCL 2 MG/2ML IJ SOLN
INTRAMUSCULAR | Status: AC
  Filled 2022-08-25: qty 2

## 2022-08-25 MED ORDER — VERAPAMIL HCL 2.5 MG/ML IV SOLN
INTRAVENOUS | Status: AC
  Filled 2022-08-25: qty 2

## 2022-08-25 MED ORDER — LIDOCAINE HCL (PF) 1 % IJ SOLN
INTRAMUSCULAR | Status: AC
  Filled 2022-08-25: qty 30

## 2022-08-25 MED ORDER — HEPARIN (PORCINE) IN NACL 1000-0.9 UT/500ML-% IV SOLN
INTRAVENOUS | Status: AC
  Filled 2022-08-25: qty 1000

## 2022-08-25 MED ORDER — SODIUM CHLORIDE 0.9 % WEIGHT BASED INFUSION: 1.0000 mL/kg/h | INTRAVENOUS | Status: DC

## 2022-08-25 MED ORDER — HEPARIN SODIUM (PORCINE) 1000 UNIT/ML IJ SOLN
INTRAMUSCULAR | Status: DC | PRN
  Administered 2022-08-25: 3500 [IU] via INTRAVENOUS

## 2022-08-25 MED ORDER — ASPIRIN 81 MG PO CHEW: 81.0000 mg | CHEWABLE_TABLET | Freq: Every day | ORAL | Status: DC

## 2022-08-25 MED ORDER — FENTANYL CITRATE (PF) 100 MCG/2ML IJ SOLN
INTRAMUSCULAR | Status: AC
  Filled 2022-08-25: qty 2

## 2022-08-25 MED ORDER — LABETALOL HCL 5 MG/ML IV SOLN: 10.0000 mg | INTRAVENOUS | Status: DC | PRN

## 2022-08-25 MED ORDER — HYDRALAZINE HCL 20 MG/ML IJ SOLN: 10.0000 mg | INTRAMUSCULAR | Status: DC | PRN

## 2022-08-25 MED ORDER — IOHEXOL 350 MG/ML SOLN
INTRAVENOUS | Status: DC | PRN
  Administered 2022-08-25: 65 mL

## 2022-08-25 MED ORDER — MIDAZOLAM HCL 2 MG/2ML IJ SOLN
INTRAMUSCULAR | Status: DC | PRN
  Administered 2022-08-25 (×2): 1 mg via INTRAVENOUS

## 2022-08-25 MED ORDER — SODIUM CHLORIDE 0.9 % WEIGHT BASED INFUSION
3.0000 mL/kg/h | INTRAVENOUS | Status: AC
  Administered 2022-08-25: 3 mL/kg/h via INTRAVENOUS

## 2022-08-25 MED ORDER — SODIUM CHLORIDE 0.9 % IV SOLN: INTRAVENOUS | Status: DC

## 2022-08-25 SURGICAL SUPPLY — 12 items
CATH 5FR JL3.5 JR4 ANG PIG MP (CATHETERS) IMPLANT
CATH BALLN WEDGE 5F 110CM (CATHETERS) IMPLANT
DEVICE RAD COMP TR BAND LRG (VASCULAR PRODUCTS) IMPLANT
GLIDESHEATH SLEND A-KIT 6F 22G (SHEATH) IMPLANT
GUIDEWIRE INQWIRE 1.5J.035X260 (WIRE) IMPLANT
INQWIRE 1.5J .035X260CM (WIRE) ×1
KIT HEART LEFT (KITS) ×1 IMPLANT
PACK CARDIAC CATHETERIZATION (CUSTOM PROCEDURE TRAY) ×1 IMPLANT
SHEATH GLIDE SLENDER 4/5FR (SHEATH) IMPLANT
SHEATH PROBE COVER 6X72 (BAG) IMPLANT
TRANSDUCER W/STOPCOCK (MISCELLANEOUS) ×1 IMPLANT
TUBING CIL FLEX 10 FLL-RA (TUBING) ×1 IMPLANT

## 2022-08-25 NOTE — Telephone Encounter (Signed)
See mychart message-cath completed 9/15

## 2022-08-25 NOTE — Interval H&P Note (Signed)
Cath Lab Visit (complete for each Cath Lab visit)  Clinical Evaluation Leading to the Procedure:   ACS: No.  Non-ACS:    Anginal Classification: CCS II  Anti-ischemic medical therapy: No Therapy  Non-Invasive Test Results: No non-invasive testing performed  Prior CABG: No previous CABG      History and Physical Interval Note:  08/25/2022 9:58 AM  Miguel Bennett  has presented today for surgery, with the diagnosis of hp.  The various methods of treatment have been discussed with the patient and family. After consideration of risks, benefits and other options for treatment, the patient has consented to  Procedure(s): RIGHT/LEFT HEART CATH AND CORONARY ANGIOGRAPHY (N/A) as a surgical intervention.  The patient's history has been reviewed, patient examined, no change in status, stable for surgery.  I have reviewed the patient's chart and labs.  Questions were answered to the patient's satisfaction.     [name] III

## 2022-08-25 NOTE — Discharge Instructions (Signed)

## 2022-08-25 NOTE — Progress Notes (Signed)
TR BAND REMOVAL  LOCATION:    right radial  DEFLATED PER PROTOCOL:    Yes.    TIME BAND OFF / DRESSING APPLIED:    1300   SITE UPON ARRIVAL:    Level 0  SITE AFTER BAND REMOVAL:    Level 0  CIRCULATION SENSATION AND MOVEMENT:    Within Normal Limits   Yes.    COMMENTS:   Hematoma developed after band came off

## 2022-08-25 NOTE — CV Procedure (Signed)
Proximal 30 to 40% LAD and 60% mid LAD beyond the first diagonal and second septal perforator. The left main and circumflex are normal. The right coronary is dominant and contains diffuse minimal irregularities. LV function reveals EF 50 to 55% with normal EDP. Right heart pressures are normal. Mean pulmonary artery pressure 16 mmHg.  Capillary wedge 3 mmHg.  Normal cardiac output.  No evidence of right to left shunting (O2 saturation 75% and main pulmonary artery).  Pulmonary hypertension not confirmed.  Needs aggressive risk factor modification due to atherosclerosis in the LAD.

## 2022-08-28 ENCOUNTER — Encounter (HOSPITAL_COMMUNITY): Payer: Self-pay | Admitting: Interventional Cardiology

## 2022-09-13 NOTE — Progress Notes (Unsigned)
Cardiology Office Note:    Date:  09/14/2022   ID:  [name], DOB 1951/09/10, MRN [id number]  PCP:  [name], MD  Cardiologist:  None  Electrophysiologist:  None   Referring MD: [name], MD   Chief Complaint  Patient presents with   Follow-up   Atrial Fibrillation    History of Present Illness:    Miguel Bennett is a 71 y.o. male with a hx of atrial fibrillation, hypertension, PUD, throat cancer who presents for follow-up.  He was referred by [name], NP for evaluation of pulmonary hypertension, initially seen 08/21/2022.  Diagnosed with A-fib in June 2023.  Started on diltiazem and Eliquis 5 mg twice daily.  Underwent successful cardioversion 06/22/2022.  Echocardiogram 07/26/2022 showed normal biventricular function, moderately enlarged RV, moderately elevated RVSP, interventricular septal flattening in systole and diastole, mild biatrial enlargement, moderate to severe TR.  Zio patch x7 days on 07/19/2022 showed no atrial fibrillation, frequent PACs (20% of beats), occasional PVCs (2% of beats).  He reports dyspnea with exertion.  Denies any chest pain, lightheadedness, syncope, lower extremity edema, palpitations.  Denies any bleeding on Eliquis.  He smoked 1 pack/day x 12 years, quit over 40 years ago.  Family history includes father had MI in 39s to 62s.  RHC/LHC on 08/25/2022 showed normal right heart pressures (RA 4 RV 34/0 PA 26/8/16 PCWP 3 LVEDP 17 CI 3.0) without pulmonary hypertension, nonobstructive CAD involving LAD with tandem 40 and 60% stenosis in proximal and mid LAD.  Since last clinic visit, he reports that he is doing well.  Denies any chest pain, dyspnea, lightheadedness, syncope, lower extremity edema, or palpitations.  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    Past Surgical History:  Procedure Laterality Date   CARDIOVERSION N/A 06/22/2022   Procedure: CARDIOVERSION;  Surgeon: [name], MD;  Location: [facility];  Service: Cardiovascular;  Laterality: N/A;   CATARACT EXTRACTION W/ INTRAOCULAR LENS  IMPLANT, BILATERAL     CHOLECYSTECTOMY     COLONOSCOPY     COLONOSCOPY WITH PROPOFOL N/A 11/08/2021   Procedure: COLONOSCOPY WITH PROPOFOL;  Surgeon: [name], MD;  Location: [facility];  Service: Endoscopy;  Laterality: N/A;   DIRECT LARYNGOSCOPY Right 03/18/2019   Procedure: DIRECT LARYNGOSCOPY WITH BIOPSY OF RIGHT TONSILLAR MASS;  Surgeon: [name], MD;  Location: [facility];  Service: ENT;  Laterality: Right;   NECK SURGERY     at [facility] to get cancer out of neck area from tonsils   POLYPECTOMY     colon   POLYPECTOMY  11/08/2021   Procedure: POLYPECTOMY;  Surgeon: [name], MD;  Location: [facility];  Service: Endoscopy;;   RIGHT/LEFT HEART CATH AND CORONARY ANGIOGRAPHY N/A 08/25/2022   Procedure: RIGHT/LEFT HEART CATH AND CORONARY ANGIOGRAPHY;  Surgeon: [name], MD;  Location: [facility];  Service: Cardiovascular;  Laterality: N/A;   STOMACH SURGERY  1984   40 % of stomach removed due to a bleeding ulcer   TONSILLECTOMY      Current Medications: Current Meds  Medication Sig   apixaban (ELIQUIS) 5 MG TABS tablet Take 1 tablet (5 mg  total) by mouth 2 (two) times daily. Resume Eliquis at 9 PM tonight   calcium carbonate (TUMS - DOSED IN MG ELEMENTAL CALCIUM) 500 MG chewable tablet Chew 1 tablet by mouth daily as needed for indigestion or heartburn.   Carboxymethylcellul-Glycerin (LUBRICATING EYE DROPS OP) Place 1 drop into both eyes 3 (three) times daily as needed (dry eyes).   Cholecalciferol (VITAMIN D) 50 MCG (2000 UT) tablet Take 2,000 Units by mouth daily.    Cyanocobalamin (VITAMIN B-12 PO) Take 1 tablet by mouth daily.     diclofenac Sodium (VOLTAREN) 1 % GEL Apply 2 g topically 2 (two) times daily as needed (pain). Applied to neck area   diltiazem (CARDIZEM CD) 180 MG 24 hr capsule Take 360 mg by mouth in the morning.   ferrous sulfate 325 (65 FE) MG tablet Take 325 mg by mouth daily with breakfast.   lisinopril-hydrochlorothiazide (ZESTORETIC) 20-12.5 MG tablet Take 0.5 tablets by mouth daily.     Allergies:   Flomax [tamsulosin] and Keflex [cephalexin]   Social History   Socioeconomic History   Marital status: Married    Spouse name: Not on file   Number of children: Not on file   Years of education: Not on file   Highest education level: Not on file  Occupational History   Not on file  Tobacco Use   Smoking status: Former    Packs/day: 1.00    Years: 12.00    Total pack years: 12.00    Types: Cigarettes    Quit date: 65    Years since quitting: 42.7   Smokeless tobacco: Never  Vaping Use   Vaping Use: Never used  Substance and Sexual Activity   Alcohol use: Not Currently    Alcohol/week: 20.0 standard drinks of alcohol    Types: 20 Standard drinks or equivalent per week    Comment: not in a couple of weeks. 06/17/19   Drug use: Never   Sexual activity: Not on file  Other Topics Concern   Not on file  Social History Narrative   Not on file   Social Determinants of Health   Financial Resource Strain: Not on file  Food Insecurity: Not on file  Transportation Needs: No Transportation Needs (06/17/2019)   PRAPARE - Hydrologist (Medical): No    Lack of Transportation (Non-Medical): No  Physical Activity: Not on file  Stress: Not on file  Social Connections: Not on file     Family History: Family history includes father had MI in 70s to 57s.  ROS:   Please see the history of present illness.     All other systems reviewed and are negative.  EKGs/Labs/Other Studies Reviewed:    The following studies were reviewed today:   EKG:   09/14/22: Sinus  bradycardia, PACs, no ST abnormalities 08/21/2022: Normal sinus rhythm, frequent PACs, rate 53  Recent Labs: 05/23/2022: Magnesium 1.7; TSH 0.927 08/21/2022: ALT 10; BNP 58.4; BUN 19; Creatinine, Ser 0.99; Platelets 287 08/25/2022: Hemoglobin 14.3; Potassium 3.7; Sodium 129  Recent Lipid Panel No results found for: "CHOL", "TRIG", "HDL", "CHOLHDL", "VLDL", "LDLCALC", "LDLDIRECT"  Physical Exam:    VS:  BP 126/74 (BP Location: Left Arm, Patient Position: Sitting, Cuff Size: Normal)   Pulse (!) 57   Ht 6' (1.829 m)   Wt 143 lb 6.4 oz (65 kg)   SpO2 97%   BMI 19.45 kg/m     Wt Readings from Last 3 Encounters:  09/14/22 143 lb 6.4  oz (65 kg)  08/25/22 145 lb (65.8 kg)  08/21/22 147 lb (66.7 kg)     GEN:  Well nourished, well developed in no acute distress HEENT: Normal NECK: No JVD; No carotid bruits CARDIAC: RRR, no murmurs, rubs, gallops RESPIRATORY:  Clear to auscultation without rales, wheezing or rhonchi  ABDOMEN: Soft, non-tender, non-distended MUSCULOSKELETAL:  No edema; No deformity  SKIN: Warm and dry NEUROLOGIC:  Alert and oriented x 3 PSYCHIATRIC:  Normal affect   ASSESSMENT:    1. Pulmonary hypertension ([place])   2. Atrial fibrillation, unspecified type ([place])   3. CAD in native artery   4. Essential hypertension     PLAN:    Pulmonary hypertension: Moderate pulmonary hypertension on echocardiogram 07/26/2022.  Echo showed moderately dilated RV with interventricular septal flattening in systole and diastole, moderate to severe TR. Appears euvolemic on exam.  RHC 08/25/22 showed normal right heart pressures without pulmonary hypertension  CAD: LHC 08/25/2022 showed nonobstructive CAD with tandem 40 and 60% stenosis in proximal and mid LAD -Check lipid panel and will plan to start statin -No aspirin as on Eliquis for A-fib  Persistent atrial fibrillation: Diagnosed with A-fib in June 2023.  Underwent successful cardioversion 06/22/2022.  CHA2DS2-VASc score 2  (hypertension, age).  Echocardiogram 07/26/2022 showed normal biventricular function, moderately enlarged RV, moderately elevated RVSP, interventricular septal flattening in systole and diastole, mild biatrial enlargement, moderate to severe TR.  Zio patch x7 days on 07/19/2022 showed no atrial fibrillation, frequent PACs (20% of beats), occasional PVCs (2% of beats). -Continue diltiazem -Continue Eliquis 5 mg twice daily -Sleep study showed OSA, planning to start CPAP  Hypertension: On diltiazem 360 mg daily and quinapril-HCTZ 10-6.25 mg daily.  Appears controlled  OSA: Recent sleep study positive, planning to start CPAP  RTC in 6 months     Medication Adjustments/Labs and Tests Ordered: Current medicines are reviewed at length with the patient today.  Concerns regarding medicines are outlined above.  Orders Placed This Encounter  Procedures   Lipid panel   EKG 12-Lead   No orders of the defined types were placed in this encounter.   Patient Instructions  Medication Instructions:  Your physician recommends that you continue on your current medications as directed. Please refer to the Current Medication list given to you today.  *If you need a refill on your cardiac medications before your next appointment, please call your pharmacy*   Lab Work: Lipid today  If you have labs (blood work) drawn today and your tests are completely normal, you will receive your results only by: [place] (if you have MyChart) OR A paper copy in the mail If you have any lab test that is abnormal or we need to change your treatment, we will call you to review the results.  Follow-Up: At [facility], you and your health needs are our priority.  As part of our continuing mission to provide you with exceptional heart care, we have created designated Provider Care Teams.  These Care Teams include your primary Cardiologist (physician) and Advanced Practice Providers (APPs -  Physician  Assistants and Nurse Practitioners) who all work together to provide you with the care you need, when you need it.  We recommend signing up for the patient portal called "MyChart".  Sign up information is provided on this After Visit Summary.  MyChart is used to connect with patients for Virtual Visits (Telemedicine).  Patients are able to view lab/test results, encounter notes, upcoming appointments, etc.  Non-urgent messages  can be sent to your provider as well.   To learn more about what you can do with MyChart, go to [email or web address].    Your next appointment:   6 month(s)  The format for your next appointment:   In Person  Provider:   Dr. Gardiner Rhyme         Signed, [name], MD  09/14/2022 3:18 PM    [place]

## 2022-09-14 ENCOUNTER — Ambulatory Visit: Payer: Medicare HMO | Attending: Cardiology | Admitting: Cardiology

## 2022-09-14 ENCOUNTER — Encounter: Payer: Self-pay | Admitting: Cardiology

## 2022-09-14 VITALS — BP 126/74 | HR 57 | Ht 72.0 in | Wt 143.4 lb

## 2022-09-14 DIAGNOSIS — I10 Essential (primary) hypertension: Secondary | ICD-10-CM | POA: Diagnosis not present

## 2022-09-14 DIAGNOSIS — I272 Pulmonary hypertension, unspecified: Secondary | ICD-10-CM | POA: Diagnosis not present

## 2022-09-14 DIAGNOSIS — I2510 Atherosclerotic heart disease of native coronary artery without angina pectoris: Secondary | ICD-10-CM | POA: Diagnosis not present

## 2022-09-14 DIAGNOSIS — I4891 Unspecified atrial fibrillation: Secondary | ICD-10-CM

## 2022-09-14 NOTE — Addendum Note (Signed)
Addended by: Freada Bergeron on: 09/14/2022 04:03 PM   Modules accepted: Orders

## 2022-09-14 NOTE — Patient Instructions (Signed)
Medication Instructions:  Your physician recommends that you continue on your current medications as directed. Please refer to the Current Medication list given to you today.  *If you need a refill on your cardiac medications before your next appointment, please call your pharmacy*   Lab Work: Lipid today  If you have labs (blood work) drawn today and your tests are completely normal, you will receive your results only by: [place] (if you have MyChart) OR A paper copy in the mail If you have any lab test that is abnormal or we need to change your treatment, we will call you to review the results.  Follow-Up: At [facility], you and your health needs are our priority.  As part of our continuing mission to provide you with exceptional heart care, we have created designated Provider Care Teams.  These Care Teams include your primary Cardiologist (physician) and Advanced Practice Providers (APPs -  Physician Assistants and Nurse Practitioners) who all work together to provide you with the care you need, when you need it.  We recommend signing up for the patient portal called "MyChart".  Sign up information is provided on this After Visit Summary.  MyChart is used to connect with patients for Virtual Visits (Telemedicine).  Patients are able to view lab/test results, encounter notes, upcoming appointments, etc.  Non-urgent messages can be sent to your provider as well.   To learn more about what you can do with MyChart, go to [email or web address].    Your next appointment:   6 month(s)  The format for your next appointment:   In Person  Provider:   Dr. Gardiner Rhyme

## 2022-09-14 NOTE — Telephone Encounter (Signed)
CPAP  Silverio Lay, RN  Freada Bergeron, CMA; Silverio Lay, RN Patient previously had sleep study and Dr. Radford Pax recommended CPAP.   Patient saw Dr. Gardiner Rhyme today and is now agreeable.   Please contact patient to discuss next steps.

## 2022-09-15 ENCOUNTER — Encounter: Payer: Self-pay | Admitting: Cardiology

## 2022-09-15 LAB — LIPID PANEL
Chol/HDL Ratio: 1.9 ratio (ref 0.0–5.0)
Cholesterol, Total: 208 mg/dL — ABNORMAL HIGH (ref 100–199)
HDL: 108 mg/dL (ref >39)
LDL Chol Calc (NIH): 90 mg/dL (ref 0–99)
Triglycerides: 54 mg/dL (ref 0–149)
VLDL Cholesterol Cal: 10 mg/dL (ref 5–40)

## 2022-09-19 ENCOUNTER — Encounter: Payer: Self-pay | Admitting: Cardiology

## 2022-09-20 MED ORDER — ROSUVASTATIN CALCIUM 5 MG PO TABS: 5.0000 mg | ORAL_TABLET | Freq: Every day | ORAL | 3 refills | Status: DC

## 2022-09-20 NOTE — Telephone Encounter (Signed)
Called patient-discussed results and recommendations.  Message sent to sleep coordinator 10/5-will follow up.     No further questions at this time.  Advised to call with any questions or concerns.

## 2022-11-01 ENCOUNTER — Telehealth: Payer: Self-pay

## 2022-11-01 ENCOUNTER — Other Ambulatory Visit: Payer: Self-pay | Admitting: Gastroenterology

## 2022-11-01 DIAGNOSIS — I2510 Atherosclerotic heart disease of native coronary artery without angina pectoris: Secondary | ICD-10-CM

## 2022-11-01 NOTE — Telephone Encounter (Signed)
   Pre-operative Risk Assessment    Patient Name: Miguel Bennett  DOB: 07-Nov-1951 MRN: [id number]      Request for Surgical Clearance    Procedure:   Colonoscopy  Date of Surgery:  Clearance 02/09/23                                 Surgeon:  [name], MD Surgeon's Group or Practice Name:  [facility], [place] Phone number:  [phone] Fax number:  [phone]   Type of Clearance Requested:   - Pharmacy:  Hold Apixaban (Eliquis)     Type of Anesthesia:   Propofol   Additional requests/questions:   How should patient hold Blood Thinner prior to procedure.  Signed, Elsie Lincoln Iyahna Obriant   11/01/2022, 12:45 PM

## 2022-11-06 DIAGNOSIS — I2510 Atherosclerotic heart disease of native coronary artery without angina pectoris: Secondary | ICD-10-CM | POA: Insufficient documentation

## 2022-11-06 NOTE — Telephone Encounter (Signed)
Patient with diagnosis of afib on Eliquis for anticoagulation.    Procedure: colonoscopy Date of procedure: 02/23/23  CHA2DS2-VASc Score = 3  This indicates a 3.2% annual risk of stroke. The patient's score is based upon: CHF History: 0 HTN History: 1 Diabetes History: 0 Stroke History: 0 Vascular Disease History: 1 Age Score: 1 Gender Score: 0  CrCl 59m/min Platelet count 250K  Per office protocol, patient can hold Eliquis for 1-2 days prior to procedure.    **This guidance is not considered finalized until pre-operative APP has relayed final recommendations.**

## 2022-11-06 NOTE — Telephone Encounter (Signed)
   Name: Miguel Bennett  DOB: [birth year]  MRN: [id number]   Primary Cardiologist: [name], MD  Chart reviewed as part of pre-operative protocol coverage. Miguel Bennett was last seen on 09/14/22 by Dr. Gardiner Rhyme. History reviewed. Last DCCV 06/2022.  [place] 08/25/22 showed normal right heart pressures without pulmonary hypertension. [place] 08/25/22 showed nonobstructive moderate CAD with recommendation for medical therapy. He was doing well at that visit, with plan for 6 month follow-up. Therefore, based on available information, per pharmD/office protocol, patient can hold Eliquis for 1-2 days prior to procedure as long as no changes in cardiac status between now and then. I left message on patient's VM per DPR to let us know if he has any concerns between now and then. I will route this recommendation to the requesting party via Epic fax function and remove from pre-op pool. Please call with questions.  Miguel Pitter, PA-C 11/06/2022, 3:39 PM

## 2022-11-11 ENCOUNTER — Emergency Department (HOSPITAL_BASED_OUTPATIENT_CLINIC_OR_DEPARTMENT_OTHER)
Admission: EM | Admit: 2022-11-11 | Discharge: 2022-11-11 | Disposition: A | Discharge status: Home / Self Care | Payer: Medicare HMO | Attending: Emergency Medicine | Admitting: Emergency Medicine

## 2022-11-11 ENCOUNTER — Emergency Department (HOSPITAL_BASED_OUTPATIENT_CLINIC_OR_DEPARTMENT_OTHER): Payer: Medicare HMO

## 2022-11-11 ENCOUNTER — Emergency Department (HOSPITAL_BASED_OUTPATIENT_CLINIC_OR_DEPARTMENT_OTHER): Payer: Medicare HMO | Admitting: Radiology

## 2022-11-11 ENCOUNTER — Encounter (HOSPITAL_BASED_OUTPATIENT_CLINIC_OR_DEPARTMENT_OTHER): Payer: Self-pay | Admitting: Emergency Medicine

## 2022-11-11 ENCOUNTER — Other Ambulatory Visit: Payer: Self-pay

## 2022-11-11 DIAGNOSIS — E871 Hypo-osmolality and hyponatremia: Secondary | ICD-10-CM | POA: Diagnosis not present

## 2022-11-11 DIAGNOSIS — N400 Enlarged prostate without lower urinary tract symptoms: Secondary | ICD-10-CM | POA: Insufficient documentation

## 2022-11-11 DIAGNOSIS — I10 Essential (primary) hypertension: Secondary | ICD-10-CM | POA: Diagnosis not present

## 2022-11-11 DIAGNOSIS — I4891 Unspecified atrial fibrillation: Secondary | ICD-10-CM | POA: Insufficient documentation

## 2022-11-11 DIAGNOSIS — W19XXXA Unspecified fall, initial encounter: Secondary | ICD-10-CM | POA: Insufficient documentation

## 2022-11-11 DIAGNOSIS — Z7901 Long term (current) use of anticoagulants: Secondary | ICD-10-CM | POA: Diagnosis not present

## 2022-11-11 DIAGNOSIS — J90 Pleural effusion, not elsewhere classified: Secondary | ICD-10-CM | POA: Insufficient documentation

## 2022-11-11 DIAGNOSIS — S2231XA Fracture of one rib, right side, initial encounter for closed fracture: Secondary | ICD-10-CM | POA: Insufficient documentation

## 2022-11-11 DIAGNOSIS — S20301A Unspecified superficial injuries of right front wall of thorax, initial encounter: Secondary | ICD-10-CM | POA: Diagnosis present

## 2022-11-11 DIAGNOSIS — I700 Atherosclerosis of aorta: Secondary | ICD-10-CM | POA: Insufficient documentation

## 2022-11-11 DIAGNOSIS — I480 Paroxysmal atrial fibrillation: Secondary | ICD-10-CM

## 2022-11-11 DIAGNOSIS — I2510 Atherosclerotic heart disease of native coronary artery without angina pectoris: Secondary | ICD-10-CM | POA: Diagnosis not present

## 2022-11-11 LAB — BASIC METABOLIC PANEL
Anion gap: 10 (ref 5–15)
BUN: 21 mg/dL (ref 8–23)
CO2: 26 mmol/L (ref 22–32)
Calcium: 10.6 mg/dL — ABNORMAL HIGH (ref 8.9–10.3)
Chloride: 95 mmol/L — ABNORMAL LOW (ref 98–111)
Creatinine, Ser: 1.2 mg/dL (ref 0.61–1.24)
GFR, Estimated: >60 mL/min (ref >60)
Glucose, Bld: 113 mg/dL — ABNORMAL HIGH (ref 70–99)
Potassium: 4 mmol/L (ref 3.5–5.1)
Sodium: 131 mmol/L — ABNORMAL LOW (ref 135–145)

## 2022-11-11 LAB — URINALYSIS, ROUTINE W REFLEX MICROSCOPIC
Bilirubin Urine: NEGATIVE
Glucose, UA: NEGATIVE mg/dL
Hgb urine dipstick: NEGATIVE
Ketones, ur: NEGATIVE mg/dL
Leukocytes,Ua: NEGATIVE
Nitrite: NEGATIVE
Protein, ur: NEGATIVE mg/dL
Specific Gravity, Urine: 1.04 — ABNORMAL HIGH (ref 1.005–1.030)
pH: 6.5 (ref 5.0–8.0)

## 2022-11-11 LAB — CBC WITH DIFFERENTIAL/PLATELET
Abs Immature Granulocytes: 0.05 10*3/uL (ref 0.00–0.07)
Basophils Absolute: 0 10*3/uL (ref 0.0–0.1)
Basophils Relative: 0 %
Eosinophils Absolute: 0 10*3/uL (ref 0.0–0.5)
Eosinophils Relative: 0 %
HCT: 43.6 % (ref 39.0–52.0)
Hemoglobin: 14.7 g/dL (ref 13.0–17.0)
Immature Granulocytes: 1 %
Lymphocytes Relative: 10 %
Lymphs Abs: 1 10*3/uL (ref 0.7–4.0)
MCH: 33.4 pg (ref 26.0–34.0)
MCHC: 33.7 g/dL (ref 30.0–36.0)
MCV: 99.1 fL (ref 80.0–100.0)
Monocytes Absolute: 1.1 10*3/uL — ABNORMAL HIGH (ref 0.1–1.0)
Monocytes Relative: 11 %
Neutro Abs: 7.7 10*3/uL (ref 1.7–7.7)
Neutrophils Relative %: 78 %
Platelets: 307 10*3/uL (ref 150–400)
RBC: 4.4 MIL/uL (ref 4.22–5.81)
RDW: 13.6 % (ref 11.5–15.5)
WBC: 9.9 10*3/uL (ref 4.0–10.5)
nRBC: 0 % (ref 0.0–0.2)

## 2022-11-11 MED ORDER — ONDANSETRON 4 MG PO TBDP: 4.0000 mg | ORAL_TABLET | Freq: Three times a day (TID) | ORAL | 0 refills | Status: DC | PRN

## 2022-11-11 MED ORDER — FENTANYL CITRATE PF 50 MCG/ML IJ SOSY
50.0000 ug | PREFILLED_SYRINGE | Freq: Once | INTRAMUSCULAR | Status: AC
  Administered 2022-11-11: 50 ug via INTRAVENOUS
  Filled 2022-11-11: qty 1

## 2022-11-11 MED ORDER — OXYCODONE-ACETAMINOPHEN 5-325 MG PO TABS: 1.0000 | ORAL_TABLET | Freq: Four times a day (QID) | ORAL | 0 refills | Status: DC | PRN

## 2022-11-11 MED ORDER — OXYCODONE-ACETAMINOPHEN 5-325 MG PO TABS
1.0000 | ORAL_TABLET | Freq: Once | ORAL | Status: AC
  Administered 2022-11-11: 1 via ORAL
  Filled 2022-11-11: qty 1

## 2022-11-11 MED ORDER — IOHEXOL 300 MG/ML  SOLN
100.0000 mL | Freq: Once | INTRAMUSCULAR | Status: AC | PRN
  Administered 2022-11-11: 80 mL via INTRAVENOUS

## 2022-11-11 MED ORDER — POLYETHYLENE GLYCOL 3350 17 G PO PACK: 17.0000 g | PACK | Freq: Every day | ORAL | 0 refills | Status: DC

## 2022-11-11 MED ORDER — ONDANSETRON HCL 4 MG/2ML IJ SOLN
4.0000 mg | Freq: Once | INTRAMUSCULAR | Status: AC
  Administered 2022-11-11: 4 mg via INTRAVENOUS
  Filled 2022-11-11: qty 2

## 2022-11-11 NOTE — ED Notes (Signed)
RT educated pt on proper use of IS. Pt set to 1500 mLs, pt able to achieve 2000 mLs. Pt verbalizes understanding.
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
## 2022-11-11 NOTE — Discharge Instructions (Addendum)
You were evaluated in the emergency department for injury following a fall.  Imaging today indicates a fracture of the 10th rib on the right side.  After further evaluation, additional imaging does not indicate blood in or around the lung.  Continue to rest, utilize pain management, and use your incentive spirometry.  Recommend close follow-up with PCP/orthopedics for reevaluation and continued medical management.  Also incidentally you were observed to be in atrial fibrillation today.  Continue your Eliquis as prescribed.  Though you are asymptomatic, follow-up closely with your cardiologist.  An internal referral has been placed for you, they should contact you within the next 2 days to schedule an appointment.  If you have not been contacted by that time, please call to be seen within the next few days.  Return to the ED for any new or worsening symptoms as discussed.

## 2022-11-13 ENCOUNTER — Telehealth (HOSPITAL_COMMUNITY): Payer: Self-pay

## 2022-11-13 NOTE — Telephone Encounter (Signed)
Left message for patient to call back  

## 2022-12-25 ENCOUNTER — Telehealth: Payer: Self-pay | Admitting: Cardiology

## 2022-12-25 ENCOUNTER — Encounter: Payer: Self-pay | Admitting: Cardiology

## 2022-12-25 NOTE — Telephone Encounter (Signed)
See mychart message-pharmD discussed with patient.

## 2022-12-25 NOTE — Telephone Encounter (Signed)
Called patient and clarify the question.  Concern about sodium content in the prescribed purgatives with his heart condition  Ok to use since it is just one time use. Reiterated importance of hydration while prep for colonoscopy.  Advise to wait for detailed instruction from gastroenterologist on how to take prescribed laxative/purgatives

## 2022-12-25 NOTE — Telephone Encounter (Signed)
Pt's wife would like a call back to discuss medication given for upcoming procedure for colonoscopy. Per MyChart message, "They have prescribed Sod sul- Potassium [place], is this ok for him to take since he is now on the Eliquis and Satin for his heart."  Requesting call back to make sure these medications will be okay to take. Please advise.

## 2022-12-26 ENCOUNTER — Encounter (HOSPITAL_COMMUNITY): Payer: Self-pay | Admitting: Gastroenterology

## 2022-12-26 NOTE — Telephone Encounter (Signed)
Looks like he is back in A-fib, can we add him on to our schedule on 1/18 at 220?

## 2022-12-26 NOTE — Telephone Encounter (Signed)
Spoke to patient, unable to come to appt prior to procedure.  Per Dr. Gardiner Rhyme, ok to proceed and follow up after.  Follow up scheduled 2/5

## 2022-12-28 ENCOUNTER — Ambulatory Visit: Payer: Medicare Other | Admitting: Cardiology

## 2023-01-02 ENCOUNTER — Ambulatory Visit (HOSPITAL_COMMUNITY)
Admission: RE | Admit: 2023-01-02 | Discharge: 2023-01-02 | Disposition: A | Discharge status: Home / Self Care | Payer: Medicare Other | Attending: Gastroenterology | Admitting: Gastroenterology

## 2023-01-02 ENCOUNTER — Encounter (HOSPITAL_COMMUNITY): Payer: Self-pay | Admitting: Gastroenterology

## 2023-01-02 ENCOUNTER — Ambulatory Visit (HOSPITAL_BASED_OUTPATIENT_CLINIC_OR_DEPARTMENT_OTHER): Payer: Medicare Other | Admitting: Anesthesiology

## 2023-01-02 ENCOUNTER — Other Ambulatory Visit: Payer: Self-pay | Admitting: Gastroenterology

## 2023-01-02 ENCOUNTER — Encounter (HOSPITAL_COMMUNITY)
Admission: RE | Disposition: A | Discharge status: Home / Self Care | Payer: Self-pay | Source: Home / Self Care | Attending: Gastroenterology

## 2023-01-02 ENCOUNTER — Ambulatory Visit (HOSPITAL_COMMUNITY): Payer: Medicare Other | Admitting: Anesthesiology

## 2023-01-02 DIAGNOSIS — Z980 Intestinal bypass and anastomosis status: Secondary | ICD-10-CM | POA: Insufficient documentation

## 2023-01-02 DIAGNOSIS — Z87891 Personal history of nicotine dependence: Secondary | ICD-10-CM

## 2023-01-02 DIAGNOSIS — K5730 Diverticulosis of large intestine without perforation or abscess without bleeding: Secondary | ICD-10-CM | POA: Insufficient documentation

## 2023-01-02 DIAGNOSIS — I2510 Atherosclerotic heart disease of native coronary artery without angina pectoris: Secondary | ICD-10-CM

## 2023-01-02 DIAGNOSIS — Z1211 Encounter for screening for malignant neoplasm of colon: Secondary | ICD-10-CM

## 2023-01-02 DIAGNOSIS — K449 Diaphragmatic hernia without obstruction or gangrene: Secondary | ICD-10-CM | POA: Insufficient documentation

## 2023-01-02 DIAGNOSIS — E785 Hyperlipidemia, unspecified: Secondary | ICD-10-CM | POA: Diagnosis not present

## 2023-01-02 DIAGNOSIS — K2100 Gastro-esophageal reflux disease with esophagitis, without bleeding: Secondary | ICD-10-CM | POA: Insufficient documentation

## 2023-01-02 DIAGNOSIS — R933 Abnormal findings on diagnostic imaging of other parts of digestive tract: Secondary | ICD-10-CM | POA: Insufficient documentation

## 2023-01-02 DIAGNOSIS — I10 Essential (primary) hypertension: Secondary | ICD-10-CM

## 2023-01-02 DIAGNOSIS — D123 Benign neoplasm of transverse colon: Secondary | ICD-10-CM | POA: Insufficient documentation

## 2023-01-02 HISTORY — PX: COLONOSCOPY WITH PROPOFOL: SHX5780

## 2023-01-02 HISTORY — PX: ESOPHAGOGASTRODUODENOSCOPY (EGD) WITH PROPOFOL: SHX5813

## 2023-01-02 HISTORY — PX: POLYPECTOMY: SHX5525

## 2023-01-02 SURGERY — COLONOSCOPY WITH PROPOFOL
Anesthesia: Monitor Anesthesia Care

## 2023-01-02 MED ORDER — PROPOFOL 1000 MG/100ML IV EMUL
INTRAVENOUS | Status: AC
  Filled 2023-01-02: qty 100

## 2023-01-02 MED ORDER — PROPOFOL 10 MG/ML IV BOLUS
INTRAVENOUS | Status: DC | PRN
  Administered 2023-01-02: 135 ug/kg/min via INTRAVENOUS

## 2023-01-02 MED ORDER — LACTATED RINGERS IV SOLN: INTRAVENOUS | Status: DC | PRN

## 2023-01-02 MED ORDER — FENTANYL CITRATE (PF) 100 MCG/2ML IJ SOLN
INTRAMUSCULAR | Status: DC | PRN
  Administered 2023-01-02: 50 ug via INTRAVENOUS

## 2023-01-02 MED ORDER — SODIUM CHLORIDE 0.9 % IV SOLN: INTRAVENOUS | Status: DC

## 2023-01-02 MED ORDER — ONDANSETRON HCL 4 MG/2ML IJ SOLN
INTRAMUSCULAR | Status: DC | PRN
  Administered 2023-01-02: 4 mg via INTRAVENOUS

## 2023-01-02 MED ORDER — FENTANYL CITRATE (PF) 100 MCG/2ML IJ SOLN
INTRAMUSCULAR | Status: AC
  Filled 2023-01-02: qty 2

## 2023-01-02 MED ORDER — LIDOCAINE 2% (20 MG/ML) 5 ML SYRINGE
INTRAMUSCULAR | Status: DC | PRN
  Administered 2023-01-02: 80 mg via INTRAVENOUS

## 2023-01-02 SURGICAL SUPPLY — 25 items

## 2023-01-02 NOTE — Discharge Instructions (Signed)

## 2023-01-02 NOTE — H&P (Signed)
Miguel Bennett HPI: He started to experience "excessive phlegm".  It is most prominent in the evenings when he lies flat.  The patient is s/p neck dissection and he did not have clear margins.  Over time his excessive phlegm improved on its own.  The barium swallow with SLP showed some pooling of the contrast in the distal esophagus.  He is also here for a routine screening colonoscopy.   Past Medical History:  Diagnosis Date   Bleeding ulcer 2017   resolved due to surgery   Cancer ([place])    tonsilar cancer   GERD (gastroesophageal reflux disease)    occasionally   History of kidney stones    Hypertension    Neck tightness    due to previous  neck surgery due to tonsilar cancer    Past Surgical History:  Procedure Laterality Date   BACK SURGERY     CARDIOVERSION N/A 06/22/2022   Procedure: CARDIOVERSION;  Surgeon: [name], MD;  Location: [facility];  Service: Cardiovascular;  Laterality: N/A;   CATARACT EXTRACTION W/ INTRAOCULAR LENS  IMPLANT, BILATERAL     CHOLECYSTECTOMY     COLONOSCOPY     COLONOSCOPY WITH PROPOFOL N/A 11/08/2021   Procedure: COLONOSCOPY WITH PROPOFOL;  Surgeon: [name], MD;  Location: [facility];  Service: Endoscopy;  Laterality: N/A;   DIRECT LARYNGOSCOPY Right 03/18/2019   Procedure: DIRECT LARYNGOSCOPY WITH BIOPSY OF RIGHT TONSILLAR MASS;  Surgeon: [name], MD;  Location: [facility];  Service: ENT;  Laterality: Right;   NECK SURGERY     at [facility] to get cancer out of neck area from tonsils   POLYPECTOMY     colon   POLYPECTOMY  11/08/2021   Procedure: POLYPECTOMY;  Surgeon: [name], MD;  Location: [facility];  Service: Endoscopy;;   RIGHT/LEFT HEART CATH AND CORONARY ANGIOGRAPHY N/A 08/25/2022   Procedure: RIGHT/LEFT HEART CATH AND CORONARY ANGIOGRAPHY;  Surgeon: [name], MD;  Location: [facility];  Service: Cardiovascular;  Laterality: N/A;   STOMACH SURGERY  1984   40 % of stomach removed due to a bleeding  ulcer   TONSILLECTOMY      History reviewed. No pertinent family history.  Social History:  reports that he quit smoking about 43 years ago. His smoking use included cigarettes. He has a 12.00 pack-year smoking history. He has never used smokeless tobacco. He reports that he does not currently use alcohol after a past usage of about 20.0 standard drinks of alcohol per week. He reports that he does not use drugs.  Allergies:  Allergies  Allergen Reactions   Flomax [Tamsulosin]     Combined with keflex caused pt to passed out   Keflex [Cephalexin]     In combination with flomax caused pt to pass out    Medications: Scheduled: Continuous:  sodium chloride      No results found for this or any previous visit (from the past 24 hour(s)).   No results found.  ROS:  As stated above in the HPI otherwise negative.  Blood pressure 125/72, pulse 84, temperature 98.5 F (36.9 C), temperature source Temporal, resp. rate 15, height 6' (1.829 m), weight 65.8 kg, SpO2 100 %.    PE: Gen: NAD, Alert and Oriented HEENT:  [place]/AT, EOMI Neck: Supple, no LAD Lungs: CTA Bilaterally CV: RRR without M/G/R ABD: Soft, NTND, +BS Ext: No C/C/E  Assessment/Plan: 1) Abnormal esophagram - EGD with possible dilation. 2) Screening colonoscopy.  Miguel Bennett D 01/02/2023, 12:30 PM

## 2023-01-02 NOTE — Op Note (Signed)
[facility] Patient Name: Miguel Bennett Procedure Date: 01/02/2023 MRN: [id number] Attending MD: [name] , MD, [id number] Date of Birth: [birth year] CSN: [id number] Age: 72 Admit Type: Outpatient Procedure:                Upper GI endoscopy Indications:              Abnormal UGI series Providers:                [name], MD, Jaci Carrel, RN, Cherylynn Ridges, Technician, Arnoldo Hooker, CRNA Referring MD:             [name], MD Medicines:                Propofol per Anesthesia Complications:            No immediate complications. Estimated Blood Loss:     Estimated blood loss: none. Procedure:                Pre-Anesthesia Assessment:                           - Prior to the procedure, a History and Physical                            was performed, and patient medications and                            allergies were reviewed. The patient's tolerance of                            previous anesthesia was also reviewed. The risks                            and benefits of the procedure and the sedation                            options and risks were discussed with the patient.                            All questions were answered, and informed consent                            was obtained. Prior Anticoagulants: The patient has                            taken no anticoagulant or antiplatelet agents. ASA                            Grade Assessment: III - A patient with severe                            systemic disease. After reviewing the risks and  benefits, the patient was deemed in satisfactory                            condition to undergo the procedure.                           - Sedation was administered by an anesthesia                            professional. Deep sedation was attained.                           After obtaining informed consent, the endoscope was                             passed under direct vision. Throughout the                            procedure, the patient's blood pressure, pulse, and                            oxygen saturations were monitored continuously. The                            [id number]-HQ190L ([id number]) Olympus colonoscope was                            introduced through the mouth, and advanced to the                            second part of duodenum. The upper GI endoscopy was                            accomplished without difficulty. The patient                            tolerated the procedure well. Scope In: Scope Out: Findings:      LA Grade D (one or more mucosal breaks involving at least 75% of       esophageal circumference) esophagitis with no bleeding was found in the       entire esophagus.      A 4 cm hiatal hernia was present.      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 A gastric       pouch with a 10 cm length from the GE junction to the gastroduodenal       anastomosis was found containing bile. The gastroduodenal anastomosis       was characterized by healthy appearing mucosa. This was traversed.      The examined duodenum was normal. Impression:               - LA Grade D reflux esophagitis with no bleeding.                           - 4 cm hiatal hernia.                           -  Patent Billroth I gastroduodenostomy was found,                            characterized by healthy appearing mucosa.                           - Normal examined duodenum.                           - No specimens collected. Moderate Sedation:      Not Applicable - Patient had care per Anesthesia. Recommendation:           - Patient has a contact number available for                            emergencies. The signs and symptoms of potential                            delayed complications were discussed with the                            patient. Return to normal activities tomorrow.                             Written discharge instructions were provided to the                            patient.                           - Resume previous diet.                           - Continue present medications.                           - PPI versus PCAB.                           - Follow up in one month. Procedure Code(s):        --- Professional ---                           [phone], Esophagogastroduodenoscopy, flexible,                            transoral; diagnostic, including collection of                            specimen(s) by brushing or washing, when performed                            (separate procedure) Diagnosis Code(s):        --- Professional ---                           K21.00, Gastro-esophageal reflux disease with  esophagitis, without bleeding                           K44.9, Diaphragmatic hernia without obstruction or                            gangrene                           Z98.0, Intestinal bypass and anastomosis status                           R93.3, Abnormal findings on diagnostic imaging of                            other parts of digestive tract CPT copyright 2022 [employer]. All rights reserved. The codes documented in this report are preliminary and upon coder review may  be revised to meet current compliance requirements. [name], MD [name], MD 01/02/2023 1:38:35 PM This report has been signed electronically. Number of Addenda: 0

## 2023-01-02 NOTE — Transfer of Care (Signed)
Immediate Anesthesia Transfer of Care Note  Patient: Miguel Bennett  Procedure(s) Performed: Procedure(s): COLONOSCOPY WITH PROPOFOL (N/A) ESOPHAGOGASTRODUODENOSCOPY (EGD) WITH PROPOFOL (N/A) POLYPECTOMY  Patient Location: PACU  Anesthesia Type:MAC  Level of Consciousness:  sedated, patient cooperative and responds to stimulation  Airway & Oxygen Therapy:Patient Spontanous Breathing and Patient connected to face mask oxgen  Post-op Assessment:  Report given to PACU RN and Post -op Vital signs reviewed and stable  Post vital signs:  Reviewed and stable  Last Vitals:  Vitals:   01/02/23 1110  BP: 125/72  Pulse: 84  Resp: 15  Temp: 36.9 C  SpO2: 295%    Complications: No apparent anesthesia complications

## 2023-01-02 NOTE — Anesthesia Postprocedure Evaluation (Signed)
Anesthesia Post Note  Patient: Miguel Bennett  Procedure(s) Performed: COLONOSCOPY WITH PROPOFOL ESOPHAGOGASTRODUODENOSCOPY (EGD) WITH PROPOFOL POLYPECTOMY     Patient location during evaluation: PACU Anesthesia Type: MAC Level of consciousness: awake and alert and oriented Pain management: pain level controlled Vital Signs Assessment: post-procedure vital signs reviewed and stable Respiratory status: spontaneous breathing, nonlabored ventilation and respiratory function stable Cardiovascular status: stable and blood pressure returned to baseline Postop Assessment: no apparent nausea or vomiting Anesthetic complications: no   No notable events documented.  Last Vitals:  Vitals:   01/02/23 1350 01/02/23 1400  BP: 115/84 123/76  Pulse: 86 93  Resp: 18 14  Temp:    SpO2: 97%     Last Pain:  Vitals:   01/02/23 1400  TempSrc:   PainSc: 0-No pain                 Janeil Schexnayder A.

## 2023-01-02 NOTE — Op Note (Signed)
[facility] Patient Name: Miguel Bennett Procedure Date: 01/02/2023 MRN: [id number] Attending MD: [name] , MD, [id number] Date of Birth: 02-Sep-1951 CSN: [id number] Age: 72 Admit Type: Outpatient Procedure:                Colonoscopy Indications:              Screening for colorectal malignant neoplasm Providers:                [name], MD, Jaci Carrel, RN, Cherylynn Ridges, Technician, Arnoldo Hooker, CRNA Referring MD:             [name], MD Medicines:                Propofol per Anesthesia Complications:            No immediate complications. Estimated Blood Loss:     Estimated blood loss: none. Procedure:                Pre-Anesthesia Assessment:                           - Prior to the procedure, a History and Physical                            was performed, and patient medications and                            allergies were reviewed. The patient's tolerance of                            previous anesthesia was also reviewed. The risks                            and benefits of the procedure and the sedation                            options and risks were discussed with the patient.                            All questions were answered, and informed consent                            was obtained. Prior Anticoagulants: The patient has                            taken no anticoagulant or antiplatelet agents. ASA                            Grade Assessment: III - A patient with severe                            systemic disease. After reviewing the risks and  benefits, the patient was deemed in satisfactory                            condition to undergo the procedure.                           - Sedation was administered by an anesthesia                            professional. Deep sedation was attained.                           After obtaining informed consent, the colonoscope                             was passed under direct vision. Throughout the                            procedure, the patient's blood pressure, pulse, and                            oxygen saturations were monitored continuously. The                            PCF-HQ190L ([id number]) Olympus colonoscope was                            introduced through the anus and advanced to the the                            cecum, identified by appendiceal orifice and                            ileocecal valve. The colonoscopy was performed with                            difficulty due to significant looping and a                            tortuous colon. Successful completion of the                            procedure was aided by straightening and shortening                            the scope to obtain bowel loop reduction. The                            patient tolerated the procedure well. The quality                            of the bowel preparation was evaluated using the  BBPS [facility] Bowel Preparation Scale) with scores                            of: Right Colon = 2 (minor amount of residual                            staining, small fragments of stool and/or opaque                            liquid, but mucosa seen well), Transverse Colon = 2                            (minor amount of residual staining, small fragments                            of stool and/or opaque liquid, but mucosa seen                            well) and Left Colon = 2 (minor amount of residual                            staining, small fragments of stool and/or opaque                            liquid, but mucosa seen well). The total BBPS score                            equals 6. The quality of the bowel preparation was                            good. The ileocecal valve, appendiceal orifice, and                            rectum were photographed. Scope In: 1:07:22 PM Scope Out: 1:29:13 PM Scope  Withdrawal Time: 0 hours 13 minutes 41 seconds  Total Procedure Duration: 0 hours 21 minutes 51 seconds  Findings:      A 3 mm polyp was found in the transverse colon. The polyp was sessile.       The polyp was removed with a cold snare. Resection and retrieval were       complete.      Scattered large-mouthed diverticula were found in the entire colon. Impression:               - One 3 mm polyp in the transverse colon, removed                            with a cold snare. Resected and retrieved.                           - Diverticulosis in the entire examined colon. Moderate Sedation:      Not Applicable - Patient had care per Anesthesia. Recommendation:           - Patient has a contact  number available for                            emergencies. The signs and symptoms of potential                            delayed complications were discussed with the                            patient. Return to normal activities tomorrow.                            Written discharge instructions were provided to the                            patient.                           - Resume previous diet.                           - Continue present medications.                           - Await pathology results.                           - Repeat colonoscopy is not recommended for                            surveillance. Procedure Code(s):        --- Professional ---                           [phone], Colonoscopy, flexible; with removal of                            tumor(s), polyp(s), or other lesion(s) by snare                            technique Diagnosis Code(s):        --- Professional ---                           D12.3, Benign neoplasm of transverse colon (hepatic                            flexure or splenic flexure)                           Z12.11, Encounter for screening for malignant                            neoplasm of colon                           K57.30, Diverticulosis of large  intestine without  perforation or abscess without bleeding CPT copyright 2022 American Medical Association. All rights reserved. The codes documented in this report are preliminary and upon coder review may  be revised to meet current compliance requirements. [name], MD [name], MD 01/02/2023 1:35:13 PM This report has been signed electronically. Number of Addenda: 0

## 2023-01-02 NOTE — Anesthesia Preprocedure Evaluation (Signed)
Anesthesia Evaluation  Patient identified by MRN, date of birth, ID band Patient awake    Reviewed: Allergy & Precautions, NPO status , Patient's Chart, lab work & pertinent test results, reviewed documented beta blocker date and time   Airway Mallampati: II  TM Distance: >3 FB Neck ROM: Full    Dental  (+) Poor Dentition, Dental Advisory Given   Pulmonary former smoker   Pulmonary exam normal breath sounds clear to auscultation       Cardiovascular hypertension, Pt. on medications and Pt. on home beta blockers pulmonary hypertension+ CAD  Normal cardiovascular exam+ dysrhythmias Atrial Fibrillation  Rhythm:Regular Rate:Normal     Neuro/Psych negative neurological ROS  negative psych ROS   GI/Hepatic Neg liver ROS, PUD,GERD  Medicated and Controlled,,Hx/o colon polyps   Endo/Other  Hyperlipidemia  Renal/GU Hx/o renal calculi  negative genitourinary   Musculoskeletal negative musculoskeletal ROS (+)    Abdominal   Peds  Hematology Eliquis therapy- last dose 1/20   Anesthesia Other Findings   Reproductive/Obstetrics                              Anesthesia Physical Anesthesia Plan  ASA: 3  Anesthesia Plan: MAC   Post-op Pain Management: Minimal or no pain anticipated   Induction: Intravenous  PONV Risk Score and Plan: 2 and Propofol infusion and Treatment may vary due to age or medical condition  Airway Management Planned: Natural Airway and Simple Face Mask  Additional Equipment: None  Intra-op Plan:   Post-operative Plan:   Informed Consent: I have reviewed the patients History and Physical, chart, labs and discussed the procedure including the risks, benefits and alternatives for the proposed anesthesia with the patient or authorized representative who has indicated his/her understanding and acceptance.     Dental advisory given  Plan Discussed with: Anesthesiologist and  CRNA  Anesthesia Plan Comments:          Anesthesia Quick Evaluation

## 2023-01-03 ENCOUNTER — Encounter (HOSPITAL_COMMUNITY): Payer: Self-pay | Admitting: Gastroenterology

## 2023-01-04 LAB — SURGICAL PATHOLOGY

## 2023-01-14 NOTE — H&P (View-Only) (Signed)
Cardiology Office Note:    Date:  01/15/2023   ID:  Miguel Bennett, DOB 1951/08/07, MRN [id number]  PCP:  [name], MD  Cardiologist:  [name], MD  Electrophysiologist:  None   Referring MD: [name], MD   Chief Complaint  Patient presents with   Follow-up   Atrial Fibrillation    History of Present Illness:    Miguel Bennett is a 72 y.o. male with a hx of atrial fibrillation, hypertension, PUD, throat cancer who presents for follow-up.  He was referred by [name], NP for evaluation of pulmonary hypertension, initially seen 08/21/2022.  Diagnosed with A-fib in June 2023.  Started on diltiazem and Eliquis 5 mg twice daily.  Underwent successful cardioversion 06/22/2022.  Echocardiogram 07/26/2022 showed normal biventricular function, moderately enlarged RV, moderately elevated RVSP, interventricular septal flattening in systole and diastole, mild biatrial enlargement, moderate to severe TR.  Zio patch x7 days on 07/19/2022 showed no atrial fibrillation, frequent PACs (20% of beats), occasional PVCs (2% of beats).  He reports dyspnea with exertion.  Denies any chest pain, lightheadedness, syncope, lower extremity edema, palpitations.  Denies any bleeding on Eliquis.  He smoked 1 pack/day x 12 years, quit over 40 years ago.  Family history includes father had MI in 59s to 43s.  RHC/LHC on 08/25/2022 showed normal right heart pressures (RA 4 RV 34/0 PA 26/8/16 PCWP 3 LVEDP 17 CI 3.0) without pulmonary hypertension, nonobstructive CAD involving LAD with tandem 40 and 60% stenosis in proximal and mid LAD.  Since last clinic visit, he reports has been doing okay.  Reports some lightheadedness but denies any syncope.  Denies any chest pain, dyspnea, lower extremity edema, or palpitations.   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    Past Surgical History:  Procedure Laterality Date   BACK SURGERY     CARDIOVERSION N/A 06/22/2022   Procedure: CARDIOVERSION;  Surgeon: [name], MD;  Location: [facility];  Service: Cardiovascular;  Laterality: N/A;   CATARACT EXTRACTION W/ INTRAOCULAR LENS  IMPLANT, BILATERAL     CHOLECYSTECTOMY     COLONOSCOPY     COLONOSCOPY WITH PROPOFOL N/A 11/08/2021   Procedure: COLONOSCOPY WITH PROPOFOL;  Surgeon: [name], MD;  Location: [facility];  Service: Endoscopy;  Laterality: N/A;   COLONOSCOPY WITH PROPOFOL N/A 01/02/2023   Procedure: COLONOSCOPY WITH PROPOFOL;  Surgeon: [name], MD;  Location: [facility];  Service: Gastroenterology;  Laterality: N/A;   DIRECT LARYNGOSCOPY Right 03/18/2019   Procedure: DIRECT LARYNGOSCOPY WITH BIOPSY OF RIGHT TONSILLAR MASS;  Surgeon: [name], MD;  Location: [facility] OR;  Service: ENT;  Laterality: Right;   ESOPHAGOGASTRODUODENOSCOPY (EGD) WITH PROPOFOL N/A 01/02/2023   Procedure: ESOPHAGOGASTRODUODENOSCOPY (EGD) WITH PROPOFOL;  Surgeon: [name], MD;  Location: [facility];  Service: Gastroenterology;  Laterality: N/A;   NECK SURGERY     at [facility] to get cancer out of neck area from tonsils   POLYPECTOMY     colon   POLYPECTOMY  11/08/2021   Procedure: POLYPECTOMY;  Surgeon: [name], MD;  Location: [facility];  Service: Endoscopy;;   POLYPECTOMY  01/02/2023   Procedure: POLYPECTOMY;  Surgeon: [name], MD;  Location: [facility];  Service: Gastroenterology;;   RIGHT/LEFT HEART CATH AND CORONARY ANGIOGRAPHY N/A 08/25/2022   Procedure: RIGHT/LEFT HEART CATH AND CORONARY ANGIOGRAPHY;  Surgeon: [name], MD;  Location: [facility];  Service: Cardiovascular;  Laterality: N/A;   STOMACH SURGERY  1984   40 % of stomach removed due to a bleeding ulcer   TONSILLECTOMY       Current Medications: Current Meds  Medication Sig   apixaban (ELIQUIS) 5 MG TABS tablet Take 1 tablet (5 mg total) by mouth 2 (two) times daily. Resume Eliquis at 9 PM tonight   calcium carbonate (TUMS - DOSED IN MG ELEMENTAL CALCIUM) 500 MG chewable tablet Chew 1 tablet by mouth daily as needed for indigestion or heartburn.   Carboxymethylcellul-Glycerin (LUBRICATING EYE DROPS OP) Place 1 drop into both eyes 3 (three) times daily as needed (dry eyes).   Cholecalciferol (VITAMIN D) 50 MCG (2000 UT) tablet Take 2,000 Units by mouth daily.    Cyanocobalamin (VITAMIN B-12) 5000 MCG TBDP Take 5,000 mg by mouth daily.   diclofenac Sodium (VOLTAREN) 1 % GEL Apply 2 g topically 2 (two) times daily as needed (pain). Applied to neck area   diltiazem (CARDIZEM CD) 180 MG 24 hr capsule Take 360 mg by mouth in the morning.   ferrous sulfate 325 (65 FE) MG tablet Take 325 mg by mouth daily with breakfast.   lisinopril-hydrochlorothiazide (ZESTORETIC) 20-12.5 MG tablet Take 0.5 tablets by mouth daily.   omeprazole (PRILOSEC) 20 MG capsule Take 20 mg by mouth daily.   rosuvastatin (CRESTOR) 5 MG tablet Take 1 tablet (5 mg total) by mouth daily.     Allergies:   Flomax [tamsulosin] and Keflex [cephalexin]   Social History   Socioeconomic History   Marital status: Married    Spouse name: Not on file   Number of children: Not on file   Years of education: Not on file   Highest education level: Not on file  Occupational History   Not on file  Tobacco Use   Smoking status: Former    Packs/day: 1.00    Years: 12.00    Total pack years: 12.00    Types: Cigarettes    Quit date: 67    Years since quitting: 43.1   Smokeless tobacco: Never  Vaping Use   Vaping Use: Never used  Substance and Sexual Activity   Alcohol use: Not Currently    Alcohol/week: 20.0 standard drinks of alcohol    Types: 20 Standard drinks or equivalent per week    Comment: not in a couple of weeks. 06/17/19   Drug use:  Never   Sexual activity: Not on file  Other Topics Concern   Not on file  Social History Narrative   Not on file   Social Determinants of Health   Financial Resource Strain: Not on file  Food Insecurity: Not on file  Transportation Needs: No Transportation Needs (06/17/2019)   PRAPARE - Hydrologist (Medical): No    Lack of Transportation (Non-Medical): No  Physical Activity: Not on file  Stress: Not on file  Social Connections: Not on file     Family History: Family history includes father had MI in 27s to 69s.  ROS:   Please see the history of present illness.     All other systems reviewed and are negative.  EKGs/Labs/Other Studies Reviewed:    The following studies were reviewed today:  EKG:   01/15/23: Afib, rate 88 09/14/22: Sinus bradycardia, PACs, no ST abnormalities 08/21/2022: Normal sinus rhythm, frequent PACs, rate 53  Recent Labs: 05/23/2022: Magnesium 1.7; TSH 0.927 08/21/2022: ALT 10; BNP 58.4 11/11/2022: BUN 21; Creatinine, Ser 1.20; Hemoglobin 14.7; Platelets 307; Potassium 4.0; Sodium 131  Recent Lipid Panel    Component Value Date/Time   CHOL 208 (H) 09/14/2022 1533   TRIG 54 09/14/2022 1533   HDL 108 09/14/2022 1533   CHOLHDL 1.9 09/14/2022 1533   [place] 90 09/14/2022 1533    Physical Exam:    VS:  BP 120/86 (BP Location: Right Arm, Patient Position: Sitting, Cuff Size: Normal)   Pulse 88   Ht 6' (1.829 m)   Wt 150 lb (68 kg)   BMI 20.34 kg/m     Wt Readings from Last 3 Encounters:  01/15/23 150 lb (68 kg)  01/02/23 145 lb (65.8 kg)  11/11/22 145 lb (65.8 kg)     GEN:  Well nourished, well developed in no acute distress HEENT: Normal NECK: No JVD; No carotid bruits CARDIAC: RRR, no murmurs, rubs, gallops RESPIRATORY:  Clear to auscultation without rales, wheezing or rhonchi  ABDOMEN: Soft, non-tender, non-distended MUSCULOSKELETAL:  No edema; No deformity  SKIN: Warm and dry NEUROLOGIC:  Alert and  oriented x 3 PSYCHIATRIC:  Normal affect   ASSESSMENT:    1. Persistent atrial fibrillation ([place])   2. Pulmonary hypertension ([place])   3. CAD in native artery   4. Essential hypertension   5. Hyperlipidemia, unspecified hyperlipidemia type      PLAN:    Persistent atrial fibrillation: Diagnosed with A-fib in June 2023.  Underwent successful cardioversion 06/22/2022.  CHA2DS2-VASc score 2 (hypertension, age).  Echocardiogram 07/26/2022 showed normal biventricular function, moderately enlarged RV, moderately elevated RVSP, interventricular septal flattening in systole and diastole, mild biatrial enlargement, moderate to severe TR.  Zio patch x7 days on 07/19/2022 showed no atrial fibrillation, frequent PACs (20% of beats), occasional PVCs (2% of beats).  He is back in atrial fibrillation clinic today -Continue diltiazem, rates appear controlled -Continue Eliquis 5 mg twice daily -Sleep study showed OSA, was recommended to start CPAP but he has not.  Strongly recommend treating sleep apnea, as suspect this is contributing to his A-fib -Will schedule for repeat cardioversion.  Refer to EP to consider ablation  Pulmonary hypertension: Moderate pulmonary hypertension on echocardiogram 07/26/2022.  Echo showed moderately dilated RV with interventricular septal flattening in systole and diastole, moderate to severe TR. Appears euvolemic on exam.  RHC 08/25/22 showed normal right heart pressures without pulmonary hypertension  CAD: LHC 08/25/2022 showed nonobstructive CAD with tandem 40 and 60% stenosis in proximal and mid LAD -Continue rosuvastatin 5 mg daily -No aspirin as on Eliquis for A-fib  Hypertension: On diltiazem 360 mg daily and lisinopril-HCTZ 10-625 mg daily.  Had home BP log was having low BP in December but recently has been normotensive.  Asked to check BP twice daily for next week and call with results  OSA: Recent sleep study positive, however he reports he did not start CPAP.  Strongly  encouraged treatment of his OSA, suspect this is contributing to his A-fib as above  Hyperlipidemia: LDL 90 on 09/14/2022, started rosuvastatin 5 mg daily.  Check lipid panel   RTC in 6 months     Medication Adjustments/Labs and Tests Ordered: Current medicines are reviewed at length with the patient today.  Concerns regarding medicines are outlined above.  Orders Placed This Encounter  Procedures  Basic metabolic panel   CBC   EKG 12-Lead   No orders of the defined types were placed in this encounter.   Patient Instructions  Medication Instructions:  Your physician recommends that you continue on your current medications as directed. Please refer to the Current Medication list given to you today.  *If you need a refill on your cardiac medications before your next appointment, please call your pharmacy*   Lab Work: BMET, CBC the week before cardioversion (anytime from 2/14-2/19)  If you have labs (blood work) drawn today and your tests are completely normal, you will receive your results only by: [place] (if you have MyChart) OR A paper copy in the mail If you have any lab test that is abnormal or we need to change your treatment, we will call you to review the results.   Testing/Procedures: Your physician has recommended that you have a Cardioversion (DCCV). Electrical Cardioversion uses a jolt of electricity to your heart either through paddles or wired patches attached to your chest. This is a controlled, usually prescheduled, procedure. Defibrillation is done under light anesthesia in the hospital, and you usually go home the day of the procedure. This is done to get your heart back into a normal rhythm. You are not awake for the procedure. Please see the instruction sheet given to you today.   Follow-Up: At [facility], you and your health needs are our priority.  As part of our continuing mission to provide you with exceptional heart care, we have  created designated Provider Care Teams.  These Care Teams include your primary Cardiologist (physician) and Advanced Practice Providers (APPs -  Physician Assistants and Nurse Practitioners) who all work together to provide you with the care you need, when you need it.  We recommend signing up for the patient portal called "MyChart".  Sign up information is provided on this After Visit Summary.  MyChart is used to connect with patients for Virtual Visits (Telemedicine).  Patients are able to view lab/test results, encounter notes, upcoming appointments, etc.  Non-urgent messages can be sent to your provider as well.   To learn more about what you can do with MyChart, go to [email or web address].    Your next appointment:   As scheduled with Dr. Gardiner Rhyme  Please check your blood pressure at home twice daily, write it down.  Call the office or send message via Mychart with the readings in 1 week for Dr. Gardiner Rhyme to review.    Other Instructions     Dear [name]  You are scheduled for a Cardioversion on Wednesday, February 21 with Dr. Gardiner Rhyme.  Please arrive at the [facility] (Main Entrance A) at [facility]: [address] [place], [place] [address] at 9:30 AM.   DIET:  Nothing to eat or drink after midnight except a sip of water with medications (see medication instructions below)  MEDICATION INSTRUCTIONS: Hold lisinopril-HCTZ AM of procedure Continue taking your anticoagulant (blood thinner): Apixaban (Eliquis).  You will need to continue this after your procedure until you are told by your provider that it is safe to stop.    LABS: within 1 week prior to procedure  FYI:  For your safety, and to allow [name] to monitor your vital signs accurately during the surgery/procedure we request: If you have artificial nails, gel coating, SNS etc, please have those removed prior to your surgery/procedure. Not having the nail coverings /polish removed may result in cancellation or  delay of your  surgery/procedure.  You must have a responsible person to drive you home and stay in the waiting area during your procedure. Failure to do so could result in cancellation.  Bring your insurance cards.  *Special Note: Every effort is made to have your procedure done on time. Occasionally there are emergencies that occur at the hospital that may cause delays. Please be patient if a delay does occur.        Signed, [name], MD  01/15/2023 11:14 PM    [facility]

## 2023-01-14 NOTE — Progress Notes (Addendum)
Cardiology Office Note:    Date:  01/15/2023   ID:  Miguel Bennett, DOB [birth year], MRN [id number]  PCP:  [name], MD  Cardiologist:  [name], MD  Electrophysiologist:  None   Referring MD: [name], MD   Chief Complaint  Patient presents with   Follow-up   Atrial Fibrillation    History of Present Illness:    Miguel Bennett is a 72 y.o. male with a hx of atrial fibrillation, hypertension, PUD, throat cancer who presents for follow-up.  He was referred by [name], NP for evaluation of pulmonary hypertension, initially seen 08/21/2022.  Diagnosed with A-fib in June 2023.  Started on diltiazem and Eliquis 5 mg twice daily.  Underwent successful cardioversion 06/22/2022.  Echocardiogram 07/26/2022 showed normal biventricular function, moderately enlarged RV, moderately elevated RVSP, interventricular septal flattening in systole and diastole, mild biatrial enlargement, moderate to severe TR.  Zio patch x7 days on 07/19/2022 showed no atrial fibrillation, frequent PACs (20% of beats), occasional PVCs (2% of beats).  He reports dyspnea with exertion.  Denies any chest pain, lightheadedness, syncope, lower extremity edema, palpitations.  Denies any bleeding on Eliquis.  He smoked 1 pack/day x 12 years, quit over 40 years ago.  Family history includes father had MI in 79s to 48s.  RHC/LHC on 08/25/2022 showed normal right heart pressures (RA 4 RV 34/0 PA 26/8/16 PCWP 3 LVEDP 17 CI 3.0) without pulmonary hypertension, nonobstructive CAD involving LAD with tandem 40 and 60% stenosis in proximal and mid LAD.  Since last clinic visit, he reports has been doing okay.  Reports some lightheadedness but denies any syncope.  Denies any chest pain, dyspnea, lower extremity edema, or palpitations.   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    Past Surgical History:  Procedure Laterality Date   BACK SURGERY     CARDIOVERSION N/A 06/22/2022   Procedure: CARDIOVERSION;  Surgeon: [name], MD;  Location: [facility];  Service: Cardiovascular;  Laterality: N/A;   CATARACT EXTRACTION W/ INTRAOCULAR LENS  IMPLANT, BILATERAL     CHOLECYSTECTOMY     COLONOSCOPY     COLONOSCOPY WITH PROPOFOL N/A 11/08/2021   Procedure: COLONOSCOPY WITH PROPOFOL;  Surgeon: [name], MD;  Location: [facility];  Service: Endoscopy;  Laterality: N/A;   COLONOSCOPY WITH PROPOFOL N/A 01/02/2023   Procedure: COLONOSCOPY WITH PROPOFOL;  Surgeon: [name], MD;  Location: [facility];  Service: Gastroenterology;  Laterality: N/A;   DIRECT LARYNGOSCOPY Right 03/18/2019   Procedure: DIRECT LARYNGOSCOPY WITH BIOPSY OF RIGHT TONSILLAR MASS;  Surgeon: [name], MD;  Location: [facility] OR;  Service: ENT;  Laterality: Right;   ESOPHAGOGASTRODUODENOSCOPY (EGD) WITH PROPOFOL N/A 01/02/2023   Procedure: ESOPHAGOGASTRODUODENOSCOPY (EGD) WITH PROPOFOL;  Surgeon: [name], MD;  Location: [facility];  Service: Gastroenterology;  Laterality: N/A;   NECK SURGERY     at [facility] to get cancer out of neck area from tonsils   POLYPECTOMY     colon   POLYPECTOMY  11/08/2021   Procedure: POLYPECTOMY;  Surgeon: [name], MD;  Location: [facility];  Service: Endoscopy;;   POLYPECTOMY  01/02/2023   Procedure: POLYPECTOMY;  Surgeon: [name], MD;  Location: [facility];  Service: Gastroenterology;;   RIGHT/LEFT HEART CATH AND CORONARY ANGIOGRAPHY N/A 08/25/2022   Procedure: RIGHT/LEFT HEART CATH AND CORONARY ANGIOGRAPHY;  Surgeon: [name], MD;  Location: [place] [facility];  Service: Cardiovascular;  Laterality: N/A;   STOMACH SURGERY  1984   40 % of stomach removed due to a bleeding ulcer   TONSILLECTOMY       Current Medications: Current Meds  Medication Sig   apixaban (ELIQUIS) 5 MG TABS tablet Take 1 tablet (5 mg total) by mouth 2 (two) times daily. Resume Eliquis at 9 PM tonight   calcium carbonate (TUMS - DOSED IN MG ELEMENTAL CALCIUM) 500 MG chewable tablet Chew 1 tablet by mouth daily as needed for indigestion or heartburn.   Carboxymethylcellul-Glycerin (LUBRICATING EYE DROPS OP) Place 1 drop into both eyes 3 (three) times daily as needed (dry eyes).   Cholecalciferol (VITAMIN D) 50 MCG (2000 UT) tablet Take 2,000 Units by mouth daily.    Cyanocobalamin (VITAMIN B-12) 5000 MCG TBDP Take 5,000 mg by mouth daily.   diclofenac Sodium (VOLTAREN) 1 % GEL Apply 2 g topically 2 (two) times daily as needed (pain). Applied to neck area   diltiazem (CARDIZEM CD) 180 MG 24 hr capsule Take 360 mg by mouth in the morning.   ferrous sulfate 325 (65 FE) MG tablet Take 325 mg by mouth daily with breakfast.   lisinopril-hydrochlorothiazide (ZESTORETIC) 20-12.5 MG tablet Take 0.5 tablets by mouth daily.   omeprazole (PRILOSEC) 20 MG capsule Take 20 mg by mouth daily.   rosuvastatin (CRESTOR) 5 MG tablet Take 1 tablet (5 mg total) by mouth daily.     Allergies:   Flomax [tamsulosin] and Keflex [cephalexin]   Social History   Socioeconomic History   Marital status: Married    Spouse name: Not on file   Number of children: Not on file   Years of education: Not on file   Highest education level: Not on file  Occupational History   Not on file  Tobacco Use   Smoking status: Former    Packs/day: 1.00    Years: 12.00    Total pack years: 12.00    Types: Cigarettes    Quit date: 68    Years since quitting: 43.1   Smokeless tobacco: Never  Vaping Use   Vaping Use: Never used  Substance and Sexual Activity   Alcohol use: Not Currently    Alcohol/week: 20.0 standard drinks of alcohol    Types: 20 Standard drinks or equivalent per week    Comment: not in a couple of weeks. 06/17/19   Drug use:  Never   Sexual activity: Not on file  Other Topics Concern   Not on file  Social History Narrative   Not on file   Social Determinants of Health   Financial Resource Strain: Not on file  Food Insecurity: Not on file  Transportation Needs: No Transportation Needs (06/17/2019)   PRAPARE - Hydrologist (Medical): No    Lack of Transportation (Non-Medical): No  Physical Activity: Not on file  Stress: Not on file  Social Connections: Not on file     Family History: Family history includes father had MI in 67s to 56s.  ROS:   Please see the history of present illness.     All other systems reviewed and are negative.  EKGs/Labs/Other Studies Reviewed:    The following studies were reviewed today:  EKG:   01/15/23: Afib, rate 88 09/14/22: Sinus bradycardia, PACs, no ST abnormalities 08/21/2022: Normal sinus rhythm, frequent PACs, rate 53  Recent Labs: 05/23/2022: Magnesium 1.7; TSH 0.927 08/21/2022: ALT 10; BNP 58.4 11/11/2022: BUN 21; Creatinine, Ser 1.20; Hemoglobin 14.7; Platelets 307; Potassium 4.0; Sodium 131  Recent Lipid Panel    Component Value Date/Time   CHOL 208 (H) 09/14/2022 1533   TRIG 54 09/14/2022 1533   HDL 108 09/14/2022 1533   CHOLHDL 1.9 09/14/2022 1533   [place] 90 09/14/2022 1533    Physical Exam:    VS:  BP 120/86 (BP Location: Right Arm, Patient Position: Sitting, Cuff Size: Normal)   Pulse 88   Ht 6' (1.829 m)   Wt 150 lb (68 kg)   BMI 20.34 kg/m     Wt Readings from Last 3 Encounters:  01/15/23 150 lb (68 kg)  01/02/23 145 lb (65.8 kg)  11/11/22 145 lb (65.8 kg)     GEN:  Well nourished, well developed in no acute distress HEENT: Normal NECK: No JVD; No carotid bruits CARDIAC: RRR, no murmurs, rubs, gallops RESPIRATORY:  Clear to auscultation without rales, wheezing or rhonchi  ABDOMEN: Soft, non-tender, non-distended MUSCULOSKELETAL:  No edema; No deformity  SKIN: Warm and dry NEUROLOGIC:  Alert and  oriented x 3 PSYCHIATRIC:  Normal affect   ASSESSMENT:    1. Persistent atrial fibrillation ([place])   2. Pulmonary hypertension ([place])   3. CAD in native artery   4. Essential hypertension   5. Hyperlipidemia, unspecified hyperlipidemia type      PLAN:    Persistent atrial fibrillation: Diagnosed with A-fib in June 2023.  Underwent successful cardioversion 06/22/2022.  CHA2DS2-VASc score 2 (hypertension, age).  Echocardiogram 07/26/2022 showed normal biventricular function, moderately enlarged RV, moderately elevated RVSP, interventricular septal flattening in systole and diastole, mild biatrial enlargement, moderate to severe TR.  Zio patch x7 days on 07/19/2022 showed no atrial fibrillation, frequent PACs (20% of beats), occasional PVCs (2% of beats).  He is back in atrial fibrillation clinic today -Continue diltiazem, rates appear controlled -Continue Eliquis 5 mg twice daily -Sleep study showed OSA, was recommended to start CPAP but he has not.  Strongly recommend treating sleep apnea, as suspect this is contributing to his A-fib -Will schedule for repeat cardioversion.  Refer to EP to consider ablation  Pulmonary hypertension: Moderate pulmonary hypertension on echocardiogram 07/26/2022.  Echo showed moderately dilated RV with interventricular septal flattening in systole and diastole, moderate to severe TR. Appears euvolemic on exam.  RHC 08/25/22 showed normal right heart pressures without pulmonary hypertension  CAD: LHC 08/25/2022 showed nonobstructive CAD with tandem 40 and 60% stenosis in proximal and mid LAD -Continue rosuvastatin 5 mg daily -No aspirin as on Eliquis for A-fib  Hypertension: On diltiazem 360 mg daily and lisinopril-HCTZ 10-625 mg daily.  Had home BP log was having low BP in December but recently has been normotensive.  Asked to check BP twice daily for next week and call with results  OSA: Recent sleep study positive, however he reports he did not start CPAP.  Strongly  encouraged treatment of his OSA, suspect this is contributing to his A-fib as above  Hyperlipidemia: LDL 90 on 09/14/2022, started rosuvastatin 5 mg daily.  Check lipid panel   RTC in 6 months     Medication Adjustments/Labs and Tests Ordered: Current medicines are reviewed at length with the patient today.  Concerns regarding medicines are outlined above.  Orders Placed This Encounter  Procedures  Basic metabolic panel   CBC   EKG 12-Lead   No orders of the defined types were placed in this encounter.   Patient Instructions  Medication Instructions:  Your physician recommends that you continue on your current medications as directed. Please refer to the Current Medication list given to you today.  *If you need a refill on your cardiac medications before your next appointment, please call your pharmacy*   Lab Work: BMET, CBC the week before cardioversion (anytime from 2/14-2/19)  If you have labs (blood work) drawn today and your tests are completely normal, you will receive your results only by: [place] (if you have MyChart) OR A paper copy in the mail If you have any lab test that is abnormal or we need to change your treatment, we will call you to review the results.   Testing/Procedures: Your physician has recommended that you have a Cardioversion (DCCV). Electrical Cardioversion uses a jolt of electricity to your heart either through paddles or wired patches attached to your chest. This is a controlled, usually prescheduled, procedure. Defibrillation is done under light anesthesia in the hospital, and you usually go home the day of the procedure. This is done to get your heart back into a normal rhythm. You are not awake for the procedure. Please see the instruction sheet given to you today.   Follow-Up: At [facility], you and your health needs are our priority.  As part of our continuing mission to provide you with exceptional heart care, we have  created designated Provider Care Teams.  These Care Teams include your primary Cardiologist (physician) and Advanced Practice Providers (APPs -  Physician Assistants and Nurse Practitioners) who all work together to provide you with the care you need, when you need it.  We recommend signing up for the patient portal called "MyChart".  Sign up information is provided on this After Visit Summary.  MyChart is used to connect with patients for Virtual Visits (Telemedicine).  Patients are able to view lab/test results, encounter notes, upcoming appointments, etc.  Non-urgent messages can be sent to your provider as well.   To learn more about what you can do with MyChart, go to [email or web address].    Your next appointment:   As scheduled with Dr. Gardiner Rhyme  Please check your blood pressure at home twice daily, write it down.  Call the office or send message via Mychart with the readings in 1 week for Dr. Gardiner Rhyme to review.    Other Instructions     Dear [name]  You are scheduled for a Cardioversion on Wednesday, February 21 with Dr. Gardiner Rhyme.  Please arrive at the [facility] (Main Entrance A) at [facility]: [address] [place], [place] [address] at 9:30 AM.   DIET:  Nothing to eat or drink after midnight except a sip of water with medications (see medication instructions below)  MEDICATION INSTRUCTIONS: Hold lisinopril-HCTZ AM of procedure Continue taking your anticoagulant (blood thinner): Apixaban (Eliquis).  You will need to continue this after your procedure until you are told by your provider that it is safe to stop.    LABS: within 1 week prior to procedure  FYI:  For your safety, and to allow [name] to monitor your vital signs accurately during the surgery/procedure we request: If you have artificial nails, gel coating, SNS etc, please have those removed prior to your surgery/procedure. Not having the nail coverings /polish removed may result in cancellation or  delay of your  surgery/procedure.  You must have a responsible person to drive you home and stay in the waiting area during your procedure. Failure to do so could result in cancellation.  Bring your insurance cards.  *Special Note: Every effort is made to have your procedure done on time. Occasionally there are emergencies that occur at the hospital that may cause delays. Please be patient if a delay does occur.        Signed, [name], MD  01/15/2023 11:14 PM    [facility]

## 2023-01-15 ENCOUNTER — Ambulatory Visit: Payer: Medicare Other | Attending: Cardiology | Admitting: Cardiology

## 2023-01-15 ENCOUNTER — Encounter: Payer: Self-pay | Admitting: Cardiology

## 2023-01-15 VITALS — BP 120/86 | HR 88 | Ht 72.0 in | Wt 150.0 lb

## 2023-01-15 DIAGNOSIS — I4819 Other persistent atrial fibrillation: Secondary | ICD-10-CM | POA: Diagnosis not present

## 2023-01-15 DIAGNOSIS — E785 Hyperlipidemia, unspecified: Secondary | ICD-10-CM

## 2023-01-15 DIAGNOSIS — I10 Essential (primary) hypertension: Secondary | ICD-10-CM | POA: Diagnosis not present

## 2023-01-15 DIAGNOSIS — I272 Pulmonary hypertension, unspecified: Secondary | ICD-10-CM | POA: Diagnosis not present

## 2023-01-15 DIAGNOSIS — I2510 Atherosclerotic heart disease of native coronary artery without angina pectoris: Secondary | ICD-10-CM

## 2023-01-15 NOTE — Patient Instructions (Addendum)
Medication Instructions:  Your physician recommends that you continue on your current medications as directed. Please refer to the Current Medication list given to you today.  *If you need a refill on your cardiac medications before your next appointment, please call your pharmacy*   Lab Work: BMET, CBC the week before cardioversion (anytime from 2/14-2/19)  If you have labs (blood work) drawn today and your tests are completely normal, you will receive your results only by: [place] (if you have MyChart) OR A paper copy in the mail If you have any lab test that is abnormal or we need to change your treatment, we will call you to review the results.   Testing/Procedures: Your physician has recommended that you have a Cardioversion (DCCV). Electrical Cardioversion uses a jolt of electricity to your heart either through paddles or wired patches attached to your chest. This is a controlled, usually prescheduled, procedure. Defibrillation is done under light anesthesia in the hospital, and you usually go home the day of the procedure. This is done to get your heart back into a normal rhythm. You are not awake for the procedure. Please see the instruction sheet given to you today.   Follow-Up: At [facility], you and your health needs are our priority.  As part of our continuing mission to provide you with exceptional heart care, we have created designated Provider Care Teams.  These Care Teams include your primary Cardiologist (physician) and Advanced Practice Providers (APPs -  Physician Assistants and Nurse Practitioners) who all work together to provide you with the care you need, when you need it.  We recommend signing up for the patient portal called "MyChart".  Sign up information is provided on this After Visit Summary.  MyChart is used to connect with patients for Virtual Visits (Telemedicine).  Patients are able to view lab/test results, encounter notes, upcoming  appointments, etc.  Non-urgent messages can be sent to your provider as well.   To learn more about what you can do with MyChart, go to [email or web address].    Your next appointment:   As scheduled with Dr. Gardiner Rhyme  Please check your blood pressure at home twice daily, write it down.  Call the office or send message via Mychart with the readings in 1 week for Dr. Gardiner Rhyme to review.    Other Instructions     Dear Miguel Bennett  You are scheduled for a Cardioversion on Wednesday, February 21 with Dr. Gardiner Rhyme.  Please arrive at the [facility] (Main Entrance A) at [facility]: [address] [place], [place] [address] at 9:30 AM.   DIET:  Nothing to eat or drink after midnight except a sip of water with medications (see medication instructions below)  MEDICATION INSTRUCTIONS: Hold lisinopril-HCTZ AM of procedure Continue taking your anticoagulant (blood thinner): Apixaban (Eliquis).  You will need to continue this after your procedure until you are told by your provider that it is safe to stop.    LABS: within 1 week prior to procedure  FYI:  For your safety, and to allow [name] to monitor your vital signs accurately during the surgery/procedure we request: If you have artificial nails, gel coating, SNS etc, please have those removed prior to your surgery/procedure. Not having the nail coverings /polish removed may result in cancellation or delay of your surgery/procedure.  You must have a responsible person to drive you home and stay in the waiting area during your procedure. Failure to do so could result in cancellation.  Bring your insurance cards.  *Special Note: Every effort is made to have your procedure done on time. Occasionally there are emergencies that occur at the hospital that may cause delays. Please be patient if a delay does occur.

## 2023-01-18 ENCOUNTER — Encounter (HOSPITAL_COMMUNITY): Payer: Self-pay | Admitting: *Deleted

## 2023-01-18 ENCOUNTER — Encounter: Payer: Self-pay | Admitting: Cardiology

## 2023-01-22 ENCOUNTER — Encounter: Payer: Self-pay | Admitting: Cardiology

## 2023-01-26 ENCOUNTER — Ambulatory Visit: Payer: Medicare Other | Admitting: Cardiology

## 2023-01-26 LAB — BASIC METABOLIC PANEL
BUN/Creatinine Ratio: 11 (ref 10–24)
BUN: 12 mg/dL (ref 8–27)
CO2: 23 mmol/L (ref 20–29)
Calcium: 10 mg/dL (ref 8.6–10.2)
Chloride: 97 mmol/L (ref 96–106)
Creatinine, Ser: 1.06 mg/dL (ref 0.76–1.27)
Glucose: 127 mg/dL — ABNORMAL HIGH (ref 70–99)
Potassium: 4.6 mmol/L (ref 3.5–5.2)
Sodium: 138 mmol/L (ref 134–144)
eGFR: 75 mL/min/{1.73_m2} (ref >59)

## 2023-01-26 LAB — CBC
Hematocrit: 44.7 % (ref 37.5–51.0)
Hemoglobin: 15.8 g/dL (ref 13.0–17.7)
MCH: 34.9 pg — ABNORMAL HIGH (ref 26.6–33.0)
MCHC: 35.3 g/dL (ref 31.5–35.7)
MCV: 99 fL — ABNORMAL HIGH (ref 79–97)
Platelets: 302 10*3/uL (ref 150–450)
RBC: 4.53 x10E6/uL (ref 4.14–5.80)
RDW: 12.3 % (ref 11.6–15.4)
WBC: 5 10*3/uL (ref 3.4–10.8)

## 2023-01-30 ENCOUNTER — Ambulatory Visit: Payer: Medicare Other | Attending: Cardiology | Admitting: Cardiology

## 2023-01-30 ENCOUNTER — Encounter: Payer: Self-pay | Admitting: Cardiology

## 2023-01-30 ENCOUNTER — Telehealth: Payer: Self-pay | Admitting: *Deleted

## 2023-01-30 VITALS — BP 106/68 | HR 77 | Ht 72.0 in | Wt 147.4 lb

## 2023-01-30 DIAGNOSIS — G4733 Obstructive sleep apnea (adult) (pediatric): Secondary | ICD-10-CM

## 2023-01-30 DIAGNOSIS — I10 Essential (primary) hypertension: Secondary | ICD-10-CM | POA: Diagnosis not present

## 2023-01-30 NOTE — Telephone Encounter (Signed)
Upon patient request DME selection is [facility]. Patient understands he will be contacted by [facility] to set up his cpap.Patient understands to call if [facility] does not contact him with new setup in a timely manner. Patient understands they will be called once confirmation has been received from [facility]/ that they have received their new machine to schedule 10 week follow up appointment.  [place] notified of new cpap order  Please add to airview Patient was grateful for the call and thanked me.

## 2023-01-30 NOTE — Progress Notes (Signed)
Sleep Medicine CONSULT Note    Date:  01/30/2023   ID:  Miguel Bennett, DOB August 08, 1951, MRN [id number]  PCP:  [name], MD  Cardiologist: [name], MD   Chief Complaint  Patient presents with   New Patient (Initial Visit)    OSA    History of Present Illness:  Miguel Bennett is a 72 y.o. male who is being seen today for the evaluation of OSA at the request of Oswaldo Milian, MD.  This is a 72 year old male with a history of tonsillar cancer, GERD, hypertension, paroxysmal atrial fibrillation and nonobstructive CAD.  He had a sleep study which showed obstructive sleep apnea and CPAP was recommended but he did not want to start CPAP.  It was recommended that he proceed with that because there was concern that this was driving his A-fib.  His sleep study was done on 07/26/2022 and that revealed mild obstructive sleep apnea with an AHI of 13.1/h.  He is now referred to sleep medicine for consultation to discuss treatment options.  He tells me that sometimes he gets sleepy during the day and sometimes will nap.  He does snore according to his wife and she also has witnessed apneic events.  He denies any am HAs.  He has never awakened himself gasping for breath or snoring. His wife says that she has heard him gasping and it wakes her up.   Past Medical History:  Diagnosis Date   Bleeding ulcer 2017   resolved due to surgery   Cancer (Miguel Bennett)    tonsilar cancer   GERD (gastroesophageal reflux disease)    occasionally   History of kidney stones    Hypertension    Neck tightness    due to previous  neck surgery due to tonsilar cancer    Past Surgical History:  Procedure Laterality Date   BACK SURGERY     CARDIOVERSION N/A 06/22/2022   Procedure: CARDIOVERSION;  Surgeon: [name], MD;  Location: [facility];  Service: Cardiovascular;  Laterality: N/A;   CATARACT EXTRACTION W/ INTRAOCULAR LENS  IMPLANT, BILATERAL     CHOLECYSTECTOMY      COLONOSCOPY     COLONOSCOPY WITH PROPOFOL N/A 11/08/2021   Procedure: COLONOSCOPY WITH PROPOFOL;  Surgeon: [name], MD;  Location: [facility];  Service: Endoscopy;  Laterality: N/A;   COLONOSCOPY WITH PROPOFOL N/A 01/02/2023   Procedure: COLONOSCOPY WITH PROPOFOL;  Surgeon: [name], MD;  Location: [facility];  Service: Gastroenterology;  Laterality: N/A;   DIRECT LARYNGOSCOPY Right 03/18/2019   Procedure: DIRECT LARYNGOSCOPY WITH BIOPSY OF RIGHT TONSILLAR MASS;  Surgeon: [name], MD;  Location: [facility] OR;  Service: ENT;  Laterality: Right;   ESOPHAGOGASTRODUODENOSCOPY (EGD) WITH PROPOFOL N/A 01/02/2023   Procedure: ESOPHAGOGASTRODUODENOSCOPY (EGD) WITH PROPOFOL;  Surgeon: [name], MD;  Location: [facility];  Service: Gastroenterology;  Laterality: N/A;   NECK SURGERY     at [facility] to get cancer out of neck area from tonsils   POLYPECTOMY     colon   POLYPECTOMY  11/08/2021   Procedure: POLYPECTOMY;  Surgeon: [name], MD;  Location: [facility];  Service: Endoscopy;;   POLYPECTOMY  01/02/2023   Procedure: POLYPECTOMY;  Surgeon: [name], MD;  Location: [facility];  Service: Gastroenterology;;   RIGHT/LEFT HEART CATH AND CORONARY ANGIOGRAPHY N/A 08/25/2022   Procedure: RIGHT/LEFT HEART CATH AND CORONARY ANGIOGRAPHY;  Surgeon: [name], MD;  Location: [facility];  Service: Cardiovascular;  Laterality: N/A;  STOMACH SURGERY  1984   40 % of stomach removed due to a bleeding ulcer   TONSILLECTOMY      Current Medications: Current Meds  Medication Sig   apixaban (ELIQUIS) 5 MG TABS tablet Take 1 tablet (5 mg total) by mouth 2 (two) times daily. Resume Eliquis at 9 PM tonight   calcium carbonate (TUMS - DOSED IN MG ELEMENTAL CALCIUM) 500 MG chewable tablet Chew 1 tablet by mouth daily as needed for indigestion or heartburn.   Carboxymethylcellul-Glycerin (LUBRICATING EYE DROPS OP) Place 1 drop into both eyes 3 (three) times daily as needed (dry  eyes).   Cholecalciferol (VITAMIN D) 50 MCG (2000 UT) tablet Take 2,000 Units by mouth daily.    Cyanocobalamin (VITAMIN B-12) 5000 MCG TBDP Take 5,000 mg by mouth daily.   diclofenac Sodium (VOLTAREN) 1 % GEL Apply 2 g topically 2 (two) times daily as needed (pain). Applied to neck area   diltiazem (CARDIZEM CD) 180 MG 24 hr capsule Take 360 mg by mouth in the morning.   ferrous sulfate 325 (65 FE) MG tablet Take 325 mg by mouth daily with breakfast.   lisinopril-hydrochlorothiazide (ZESTORETIC) 20-12.5 MG tablet Take 0.5 tablets by mouth daily.   omeprazole (PRILOSEC) 20 MG capsule Take 20 mg by mouth daily.   rosuvastatin (CRESTOR) 5 MG tablet Take 1 tablet (5 mg total) by mouth daily.    Allergies:   Flomax [tamsulosin] and Keflex [cephalexin]   Social History   Socioeconomic History   Marital status: Married    Spouse name: Not on file   Number of children: Not on file   Years of education: Not on file   Highest education level: Not on file  Occupational History   Not on file  Tobacco Use   Smoking status: Former    Packs/day: 1.00    Years: 12.00    Total pack years: 12.00    Types: Cigarettes    Quit date: 5    Years since quitting: 43.1   Smokeless tobacco: Never  Vaping Use   Vaping Use: Never used  Substance and Sexual Activity   Alcohol use: Not Currently    Alcohol/week: 20.0 standard drinks of alcohol    Types: 20 Standard drinks or equivalent per week    Comment: not in a couple of weeks. 06/17/19   Drug use: Never   Sexual activity: Not on file  Other Topics Concern   Not on file  Social History Narrative   Not on file   Social Determinants of Health   Financial Resource Strain: Not on file  Food Insecurity: Not on file  Transportation Needs: No Transportation Needs (06/17/2019)   PRAPARE - Hydrologist (Medical): No    Lack of Transportation (Non-Medical): No  Physical Activity: Not on file  Stress: Not on file  Social  Connections: Not on file     Family History:  The patient's family history is not on file.   ROS:   Please see the history of present illness.    ROS All other systems reviewed and are negative.      No data to display             PHYSICAL EXAM:   VS:  BP 106/68   Pulse 77   Ht 6' (1.829 m)   Wt 147 lb 6.4 oz (66.9 kg)   SpO2 98%   BMI 19.99 kg/m    GEN: Well nourished, well developed, in  no acute distress  HEENT: normal  Neck: no JVD, carotid bruits, or masses Cardiac: RRR; no murmurs, rubs, or gallops,no edema.  Intact distal pulses bilaterally.  Respiratory:  clear to auscultation bilaterally, normal work of breathing GI: soft, nontender, nondistended, + BS MS: no deformity or atrophy  Skin: warm and dry, no rash Neuro:  Alert and Oriented x 3, Strength and sensation are intact Psych: euthymic mood, full affect  Wt Readings from Last 3 Encounters:  01/30/23 147 lb 6.4 oz (66.9 kg)  01/15/23 150 lb (68 kg)  01/02/23 145 lb (65.8 kg)      Studies/Labs Reviewed:   PSG  Recent Labs: 05/23/2022: Magnesium 1.7; TSH 0.927 08/21/2022: ALT 10; BNP 58.4 01/25/2023: BUN 12; Creatinine, Ser 1.06; Hemoglobin 15.8; Platelets 302; Potassium 4.6; Sodium 138    CHA2DS2-VASc Score = 3   This indicates a 3.2% annual risk of stroke. The patient's score is based upon: CHF History: 0 HTN History: 1 Diabetes History: 0 Stroke History: 0 Vascular Disease History: 1 Age Score: 1 Gender Score: 0    Additional studies/ records that were reviewed today include:  none    ASSESSMENT:    1. OSA (obstructive sleep apnea)   2. Essential hypertension      PLAN:  In order of problems listed above:  OSA  -Patient sleep study was ordered because of atrial fibrillation and there was concern that his sleep apnea was driving his A-fib.   -He was found to have mild obstructive sleep apnea with an AHI of 13.1/h and CPAP was ordered but he chose not to proceed with that -I  have discussed treatment options with him -he is not a candidate for Inspire as his AHI is < 15/hr -the oral device is not always effective and is $3K out of pocket -I have recommended CPAP therapy because I suspect that his OSA is driving his AFib.  He is not sure he wants to pursue the CPAP but if he does he wants to try a nasal pillow mask -he will call and let me know his decision  Hypertension -BP controlled on exam today -Continue prescription drug management with Cardizem CD  360 mg daily and lisinopril HCT 20-12.5 mg 1/2 tablet daily with as needed refills   Time Spent: 20 minutes total time of encounter, including 15 minutes spent in face-to-face patient care on the date of this encounter. This time includes coordination of care and counseling regarding above mentioned problem list. Remainder of non-face-to-face time involved reviewing chart documents/testing relevant to the patient encounter and documentation in the medical record. I have independently reviewed documentation from referring provider  Medication Adjustments/Labs and Tests Ordered: Current medicines are reviewed at length with the patient today.  Concerns regarding medicines are outlined above.  Medication changes, Labs and Tests ordered today are listed in the Patient Instructions below.  There are no Patient Instructions on file for this visit.   Signed, Fransico Him, MD  01/30/2023 3:15 PM    [facility] [address], [place], [place]  [address] Phone: [phone]; Fax: [phone]

## 2023-01-30 NOTE — Patient Instructions (Addendum)
Medication Instructions:  Your physician recommends that you continue on your current medications as directed. Please refer to the Current Medication list given to you today.  *If you need a refill on your cardiac medications before your next appointment, please call your pharmacy*   Lab Work: None.  If you have labs (blood work) drawn today and your tests are completely normal, you will receive your results only by: [place] (if you have MyChart) OR A paper copy in the mail If you have any lab test that is abnormal or we need to change your treatment, we will call you to review the results.   Testing/Procedures: None.   Follow-Up:  Your next appointment will be as needed and it will be with:    Provider:   Dr. Fransico Him, MD   Dr. Radford Pax has ordered a cpap machine with a nasal pillow mask for you. A member of our sleep team may be reaching out to you regarding insurance coverage. A DME (durable medical equipment) company approved by your insurance may contact you in the next few weeks about delivery of your equipment once approved.

## 2023-01-30 NOTE — Telephone Encounter (Signed)
-----   Message from Joni Reining, RN sent at 01/30/2023  3:57 PM EST ----- Regarding: Orders for Cpap Cpap orders for this gentleman:  Resmed Auto Cpap 4-15 cm H20 With nasal pillow mask and chin strap  Guinevere Ferrari

## 2023-01-31 ENCOUNTER — Encounter (HOSPITAL_COMMUNITY): Payer: Self-pay | Admitting: Cardiology

## 2023-01-31 ENCOUNTER — Ambulatory Visit (HOSPITAL_COMMUNITY): Payer: Medicare Other | Admitting: Anesthesiology

## 2023-01-31 ENCOUNTER — Ambulatory Visit (HOSPITAL_COMMUNITY)
Admission: RE | Admit: 2023-01-31 | Discharge: 2023-01-31 | Disposition: A | Discharge status: Home / Self Care | Payer: Medicare Other | Attending: Cardiology | Admitting: Cardiology

## 2023-01-31 ENCOUNTER — Ambulatory Visit (HOSPITAL_BASED_OUTPATIENT_CLINIC_OR_DEPARTMENT_OTHER): Payer: Medicare Other | Admitting: Anesthesiology

## 2023-01-31 ENCOUNTER — Other Ambulatory Visit: Payer: Self-pay

## 2023-01-31 ENCOUNTER — Encounter (HOSPITAL_COMMUNITY)
Admission: RE | Disposition: A | Discharge status: Home / Self Care | Payer: Self-pay | Source: Home / Self Care | Attending: Cardiology

## 2023-01-31 DIAGNOSIS — K219 Gastro-esophageal reflux disease without esophagitis: Secondary | ICD-10-CM | POA: Insufficient documentation

## 2023-01-31 DIAGNOSIS — I4819 Other persistent atrial fibrillation: Secondary | ICD-10-CM

## 2023-01-31 DIAGNOSIS — I4891 Unspecified atrial fibrillation: Secondary | ICD-10-CM | POA: Insufficient documentation

## 2023-01-31 DIAGNOSIS — I119 Hypertensive heart disease without heart failure: Secondary | ICD-10-CM

## 2023-01-31 DIAGNOSIS — I2510 Atherosclerotic heart disease of native coronary artery without angina pectoris: Secondary | ICD-10-CM

## 2023-01-31 DIAGNOSIS — Z8249 Family history of ischemic heart disease and other diseases of the circulatory system: Secondary | ICD-10-CM | POA: Insufficient documentation

## 2023-01-31 DIAGNOSIS — Z7901 Long term (current) use of anticoagulants: Secondary | ICD-10-CM | POA: Insufficient documentation

## 2023-01-31 DIAGNOSIS — I480 Paroxysmal atrial fibrillation: Secondary | ICD-10-CM

## 2023-01-31 DIAGNOSIS — I10 Essential (primary) hypertension: Secondary | ICD-10-CM | POA: Insufficient documentation

## 2023-01-31 DIAGNOSIS — Z87891 Personal history of nicotine dependence: Secondary | ICD-10-CM

## 2023-01-31 DIAGNOSIS — I272 Pulmonary hypertension, unspecified: Secondary | ICD-10-CM | POA: Insufficient documentation

## 2023-01-31 DIAGNOSIS — Z79899 Other long term (current) drug therapy: Secondary | ICD-10-CM | POA: Diagnosis not present

## 2023-01-31 DIAGNOSIS — Z8711 Personal history of peptic ulcer disease: Secondary | ICD-10-CM | POA: Diagnosis not present

## 2023-01-31 DIAGNOSIS — I071 Rheumatic tricuspid insufficiency: Secondary | ICD-10-CM | POA: Insufficient documentation

## 2023-01-31 HISTORY — PX: CARDIOVERSION: SHX1299

## 2023-01-31 SURGERY — CARDIOVERSION
Anesthesia: General

## 2023-01-31 MED ORDER — SODIUM CHLORIDE 0.9 % IV SOLN: INTRAVENOUS | Status: DC

## 2023-01-31 MED ORDER — PROPOFOL 10 MG/ML IV BOLUS
INTRAVENOUS | Status: DC | PRN
  Administered 2023-01-31: 20 mg via INTRAVENOUS
  Administered 2023-01-31: 80 mg via INTRAVENOUS

## 2023-01-31 NOTE — Anesthesia Preprocedure Evaluation (Addendum)
Anesthesia Evaluation  Patient identified by MRN, date of birth, ID band Patient awake    Reviewed: Allergy & Precautions, NPO status , Patient's Chart, lab work & pertinent test results  Airway Mallampati: II  TM Distance: >3 FB Neck ROM: Full    Dental  (+) Dental Advisory Given   Pulmonary former smoker   Pulmonary exam normal breath sounds clear to auscultation       Cardiovascular hypertension, Pt. on medications and Pt. on home beta blockers pulmonary hypertension+ CAD  + dysrhythmias Atrial Fibrillation + Valvular Problems/Murmurs (Mod-severe TR)  Rhythm:Irregular Rate:Tachycardia + Systolic murmurs Echo [id number]  1. Left ventricular ejection fraction, by estimation, is 60 to 65%. The left ventricle has normal function. The left ventricle has no regional wall motion abnormalities. Left ventricular diastolic parameters are indeterminate. There is the interventricular septum is flattened in systole and diastole, consistent with right ventricular pressure and volume overload.   2. Right ventricular systolic function is normal. The right ventricular size is moderately enlarged. There is moderately elevated pulmonary artery systolic pressure.   3. Left atrial size was mildly dilated.   4. Right atrial size was mildly dilated.   5. The mitral valve is normal in structure. Trivial mitral valve regurgitation.   6. Tricuspid valve regurgitation is moderate to severe.   7. The aortic valve is normal in structure. Aortic valve regurgitation is not visualized.   8. There is borderline dilatation of the ascending aorta, measuring 54m.   9. Hepatic flow reversal assessment is challenging. The inferior vena cava is normal in size with greater than 50% respiratory variability, suggesting right atrial pressure of 3 mmHg.     Neuro/Psych negative neurological ROS  negative psych ROS   GI/Hepatic Neg liver ROS, PUD,GERD  Medicated and  Controlled,,Hx/o colon polyps   Endo/Other  Hyperlipidemia  Renal/GU Hx/o renal calculi     Musculoskeletal negative musculoskeletal ROS (+)    Abdominal   Peds  Hematology Eliquis therapy- last dose 1/20   Anesthesia Other Findings   Reproductive/Obstetrics                             Anesthesia Physical Anesthesia Plan  ASA: 3  Anesthesia Plan: General   Post-op Pain Management: Minimal or no pain anticipated   Induction: Intravenous  PONV Risk Score and Plan: 3 and Propofol infusion, Treatment may vary due to age or medical condition and TIVA  Airway Management Planned:   Additional Equipment: None  Intra-op Plan:   Post-operative Plan:   Informed Consent: I have reviewed the patients History and Physical, chart, labs and discussed the procedure including the risks, benefits and alternatives for the proposed anesthesia with the patient or authorized representative who has indicated his/her understanding and acceptance.     Dental advisory given  Plan Discussed with: CRNA  Anesthesia Plan Comments:         Anesthesia Quick Evaluation

## 2023-01-31 NOTE — CV Procedure (Signed)
Procedure:   DCCV  Indication:  Symptomatic atrial fibrillation  Procedure Note:  The patient signed informed consent.  They have had had therapeutic anticoagulation with Eliquis greater than 3 weeks.  Anesthesia was administered by Dr. Lissa Hoard.  Adequate airway was maintained throughout and vital followed per protocol.  They were cardioverted x 1 with 200J of biphasic synchronized energy.  They converted to NSR with rate 60-70s.  There were no apparent complications.  The patient had normal neuro status and respiratory status post procedure with vitals stable as recorded elsewhere.    Follow up:  They will continue on current medical therapy and follow up with cardiology as scheduled.  Oswaldo Milian, MD 01/31/2023 10:18 AM

## 2023-01-31 NOTE — Discharge Instructions (Signed)

## 2023-01-31 NOTE — Transfer of Care (Addendum)
Immediate Anesthesia Transfer of Care Note  Patient: Miguel Bennett  Procedure(s) Performed: CARDIOVERSION  Patient Location: Endoscopy Unit  Anesthesia Type:MAC  Level of Consciousness: awake, alert , and oriented  Airway & Oxygen Therapy: Patient Spontanous Breathing  Post-op Assessment: Report given to RN and Post -op Vital signs reviewed and stable  Post vital signs: Reviewed and stable  Last Vitals:  Vitals Value Taken Time  BP 109/81 01/31/23 1030  Temp 36.4 C 01/31/23 1023  Pulse 68 01/31/23 1034  Resp 11 01/31/23 1034  SpO2 97 % 01/31/23 1034  Vitals shown include unvalidated device data.  Last Pain:  Vitals:   01/31/23 1030  TempSrc:   PainSc: 0-No pain         Complications: No notable events documented.

## 2023-01-31 NOTE — Interval H&P Note (Signed)
History and Physical Interval Note:  01/31/2023 10:11 AM  Miguel Bennett  has presented today for surgery, with the diagnosis of AFIB.  The various methods of treatment have been discussed with the patient and family. After consideration of risks, benefits and other options for treatment, the patient has consented to  Procedure(s): CARDIOVERSION (N/A) as a surgical intervention.  The patient's history has been reviewed, patient examined, no change in status, stable for surgery.  I have reviewed the patient's chart and labs.  Questions were answered to the patient's satisfaction.     [name]

## 2023-02-01 ENCOUNTER — Other Ambulatory Visit: Payer: Self-pay | Admitting: *Deleted

## 2023-02-01 DIAGNOSIS — I4819 Other persistent atrial fibrillation: Secondary | ICD-10-CM

## 2023-02-01 NOTE — Anesthesia Postprocedure Evaluation (Addendum)
Anesthesia Post Note  Patient: Miguel Bennett  Procedure(s) Performed: CARDIOVERSION     Patient location during evaluation: Endoscopy Anesthesia Type: General Level of consciousness: sedated and patient cooperative Pain management: pain level controlled Vital Signs Assessment: post-procedure vital signs reviewed and stable Respiratory status: spontaneous breathing Cardiovascular status: stable Anesthetic complications: no   No notable events documented.  Last Vitals:  Vitals:   01/31/23 1030 01/31/23 1035  BP: 109/81 120/78  Pulse: 67 70  Resp: 13 16  Temp:    SpO2: 97% 97%    Last Pain:  Vitals:   01/31/23 1035  TempSrc:   PainSc: 0-No pain                 Nolon Nations

## 2023-02-04 ENCOUNTER — Encounter (HOSPITAL_COMMUNITY): Payer: Self-pay | Admitting: Cardiology

## 2023-03-12 ENCOUNTER — Encounter: Payer: Self-pay | Admitting: Cardiovascular Disease

## 2023-03-12 ENCOUNTER — Ambulatory Visit: Payer: Medicare Other | Attending: Cardiovascular Disease | Admitting: Cardiovascular Disease

## 2023-03-12 VITALS — BP 114/70 | HR 53 | Ht 72.0 in | Wt 143.0 lb

## 2023-03-12 DIAGNOSIS — I480 Paroxysmal atrial fibrillation: Secondary | ICD-10-CM

## 2023-03-12 DIAGNOSIS — I10 Essential (primary) hypertension: Secondary | ICD-10-CM

## 2023-03-12 NOTE — Patient Instructions (Signed)
Medication Instructions:  Your physician recommends that you continue on your current medications as directed. Please refer to the Current Medication list given to you today. *If you need a refill on your cardiac medications before your next appointment, please call your pharmacy*   Follow-Up: At [facility], you and your health needs are our priority.  As part of our continuing mission to provide you with exceptional heart care, we have created designated Provider Care Teams.  These Care Teams include your primary Cardiologist (physician) and Advanced Practice Providers (APPs -  Physician Assistants and Nurse Practitioners) who all work together to provide you with the care you need, when you need it.  We recommend signing up for the patient portal called "MyChart".  Sign up information is provided on this After Visit Summary.  MyChart is used to connect with patients for Virtual Visits (Telemedicine).  Patients are able to view lab/test results, encounter notes, upcoming appointments, etc.  Non-urgent messages can be sent to your provider as well.   To learn more about what you can do with MyChart, go to [email or web address].    Your next appointment:   As needed  Provider:   Doralee Albino, MD

## 2023-03-12 NOTE — Progress Notes (Signed)
Electrophysiology Office Note:    Date:  03/12/2023   ID:  Miguel Bennett, DOB 02-Sep-1951, MRN [id number]  PCP:  [name], MD   [place] [facility] Cardiologist:  [name], MD Electrophysiologist:  Melida Quitter, MD     Referring MD: [name]*   History of Present Illness:    Miguel Bennett is a 72 y.o. male with a hx listed below, significant for atrial fibrillation and hypertension, referred for arrhythmia management.  He was diagnosed with atrial fibrillation in June 2023 and managed with diltiazem and Eliquis.  Successful DC cardioversion was performed on June 22, 2022.    He was referred to Dr. Nechama Guard for management of pulmonary hypertension based upon evidence of elevated RV pressure and volume on echocardiogram in October 2023. When seen by Dr. Gardiner Rhyme on January 15, 2023, he was back in atrial fibrillation.  A second cardioversion was performed on January 31, 2023.  He reports that he has no symptoms of atrial fibrillation and felt no different in energy level, shortness of breath after his cardioversion.  He denies having palpitations.  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    Past Surgical History:  Procedure Laterality Date   BACK SURGERY     CARDIOVERSION N/A 06/22/2022   Procedure: CARDIOVERSION;  Surgeon: [name], MD;  Location: [place];  Service: Cardiovascular;  Laterality: N/A;   CARDIOVERSION N/A 01/31/2023   Procedure: CARDIOVERSION;  Surgeon: [name], MD;  Location: [place];  Service: Cardiovascular;  Laterality: N/A;   CATARACT EXTRACTION W/ INTRAOCULAR LENS  IMPLANT, BILATERAL     CHOLECYSTECTOMY     COLONOSCOPY      COLONOSCOPY WITH PROPOFOL N/A 11/08/2021   Procedure: COLONOSCOPY WITH PROPOFOL;  Surgeon: [name], MD;  [facility];  Service: Endoscopy;  Laterality: N/A;   COLONOSCOPY WITH PROPOFOL N/A 01/02/2023   Procedure: COLONOSCOPY WITH PROPOFOL;  Surgeon: [name], MD;  Location: [facility];  Service: Gastroenterology;  Laterality: N/A;   DIRECT LARYNGOSCOPY Right 03/18/2019   Procedure: DIRECT LARYNGOSCOPY WITH BIOPSY OF RIGHT TONSILLAR MASS;  Surgeon: [name], MD;  Location: [facility] OR;  Service: ENT;  Laterality: Right;   ESOPHAGOGASTRODUODENOSCOPY (EGD) WITH PROPOFOL N/A 01/02/2023   Procedure: ESOPHAGOGASTRODUODENOSCOPY (EGD) WITH PROPOFOL;  Surgeon: [name], MD;  [facility];  Service: Gastroenterology;  Laterality: N/A;   NECK SURGERY     at [facility] to get cancer out of neck area from tonsils   POLYPECTOMY     colon   POLYPECTOMY  11/08/2021   Procedure: POLYPECTOMY;  Surgeon: [name], MD;  [facility];  Service: Endoscopy;;   POLYPECTOMY  01/02/2023   Procedure: POLYPECTOMY;  Surgeon: [name], MD;  [facility];  Service: Gastroenterology;;   RIGHT/LEFT HEART CATH AND CORONARY ANGIOGRAPHY N/A 08/25/2022   Procedure: RIGHT/LEFT HEART CATH AND CORONARY ANGIOGRAPHY;  Surgeon: [name], MD;  Location: [place] CV [facility];  Service: Cardiovascular;  Laterality: N/A;   STOMACH SURGERY  1984   40 % of stomach removed due to a bleeding ulcer   TONSILLECTOMY      Current Medications: Current Meds  Medication Sig   apixaban (ELIQUIS) 5 MG TABS tablet Take 1 tablet (5 mg total) by mouth 2 (two) times daily. Resume Eliquis at 9 PM tonight   calcium carbonate (TUMS - DOSED IN MG ELEMENTAL CALCIUM) 500 MG chewable tablet Chew 1 tablet by mouth daily as needed for indigestion or heartburn.   Carboxymethylcellul-Glycerin (LUBRICATING EYE DROPS OP) Place 1 drop into both eyes 3 (three) times daily as needed (dry eyes).    Cholecalciferol (VITAMIN D) 50 MCG (2000 UT) tablet Take 2,000 Units by mouth daily.    Cyanocobalamin (VITAMIN B-12) 5000 MCG TBDP Take 5,000 mg by mouth daily.   diclofenac Sodium (VOLTAREN) 1 % GEL Apply 2 g topically 2 (two) times daily as needed (pain). Applied to neck area   diltiazem (CARDIZEM CD) 180 MG 24 hr capsule Take 360 mg by mouth in the morning.   ferrous sulfate 325 (65 FE) MG tablet Take 325 mg by mouth daily with breakfast.   lisinopril-hydrochlorothiazide (ZESTORETIC) 20-12.5 MG tablet Take 0.5 tablets by mouth daily.   omeprazole (PRILOSEC) 20 MG capsule Take 20 mg by mouth daily.   rosuvastatin (CRESTOR) 5 MG tablet Take 1 tablet (5 mg total) by mouth daily.     Allergies:   Flomax [tamsulosin] and Keflex [cephalexin]   Social and Family History: Reviewed in Epic  ROS:   Please see the history of present illness.    All other systems reviewed and are negative.  EKGs/Labs/Other Studies Reviewed Today:    Echocardiogram:  TTE 07/26/22 EF 60 to 65%, interventricular septum is flattened, consistent with RV pressure and volume overload.  LA is mildly dilated   Monitors:  7-day Zio patch 07/2022 Sinus rhythm, 20% PACs, rare PVCs  Stress testing:   Advanced imaging:  Cardiac cath: Nonobstructive coronary disease with 40 and 60% stenoses in the proximal and mid LAD.  Normal right heart pressures without documentation of pulmonary hypertension.   EKG:  Last EKG results: today - sinus bradycardia   Recent Labs: 05/23/2022: Magnesium 1.7; TSH 0.927 08/21/2022: ALT 10; BNP 58.4 01/25/2023: BUN 12; Creatinine, Ser 1.06; Hemoglobin 15.8; Platelets 302; Potassium 4.6; Sodium 138     Physical Exam:    VS:  BP 114/70   Pulse (!) 53   Ht 6' (1.829 m)   Wt 143 lb (64.9 kg)   SpO2 98%   BMI 19.39 kg/m     Wt Readings from Last 3 Encounters:  03/12/23 143 lb (64.9 kg)  01/31/23 148 lb (67.1 kg)  01/30/23 147 lb 6.4 oz (66.9 kg)     GEN: Well nourished,  well developed in no acute distress CARDIAC: RRR, no murmurs, rubs, gallops RESPIRATORY:  Normal work of breathing MUSCULOSKELETAL: no edema    ASSESSMENT & PLAN:    Persistent atrial fibrillation Maintaining sinus after cardioversion  He is not inclined to undergo ablation at this time since he is asymptomatic, and his rates are controlled. 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  Concerns regarding medicines are outlined above.  Orders Placed This Encounter  Procedures   EKG 12-Lead   No orders of the defined types were placed in this encounter.    Signed, Melida Quitter, MD  03/12/2023 2:25 PM    [place]

## 2023-03-19 ENCOUNTER — Encounter: Payer: Self-pay | Admitting: Cardiology

## 2023-03-21 NOTE — Telephone Encounter (Signed)
That is fine to hold off on appointment for now since he was just seen by Dr. Nelly Laurence and we can plan to see him in August.  Heart rate in 50s is fine.  Would strongly recommend starting CPAP, suspect his A-fib is coming from sleep apnea

## 2023-03-23 ENCOUNTER — Ambulatory Visit: Payer: Medicare Other | Admitting: Cardiology

## 2023-07-15 NOTE — Progress Notes (Deleted)
Cardiology Clinic Note   Patient Name: Miguel Bennett Date of Encounter: 07/15/2023  Primary Care Provider:  Barbie Banner, MD Primary Cardiologist:  Little Ishikawa, MD  Patient Profile    Miguel Bennett 72 year old male presents the clinic today for follow-up evaluation of his hypertension, coronary artery disease, and atrial fibrillation.  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   Past Surgical History:  Procedure Laterality Date   BACK SURGERY     CARDIOVERSION N/A 06/22/2022   Procedure: CARDIOVERSION;  Surgeon: Jodelle Red, MD;  Location: [facility] ENDOSCOPY;  Service: Cardiovascular;  Laterality: N/A;   CARDIOVERSION N/A 01/31/2023   Procedure: CARDIOVERSION;  Surgeon: Little Ishikawa, MD;  Location: [facility] ENDOSCOPY;  Service: Cardiovascular;  Laterality: N/A;   CATARACT EXTRACTION W/ INTRAOCULAR LENS  IMPLANT, BILATERAL     CHOLECYSTECTOMY     COLONOSCOPY     COLONOSCOPY WITH PROPOFOL N/A 11/08/2021   Procedure: COLONOSCOPY WITH PROPOFOL;  Surgeon: Jeani Hawking, MD;  [facility];  Service: Endoscopy;  Laterality: N/A;   COLONOSCOPY WITH PROPOFOL N/A 01/02/2023   Procedure: COLONOSCOPY WITH PROPOFOL;  Surgeon: Jeani Hawking, MD;  [facility];  Service: Gastroenterology;  Laterality: N/A;   DIRECT LARYNGOSCOPY Right 03/18/2019   Procedure: DIRECT LARYNGOSCOPY WITH BIOPSY OF RIGHT TONSILLAR MASS;  Surgeon: Suzanna Obey, MD;  Location: [facility] OR;  Service: ENT;  Laterality: Right;   ESOPHAGOGASTRODUODENOSCOPY (EGD) WITH PROPOFOL N/A 01/02/2023   Procedure: ESOPHAGOGASTRODUODENOSCOPY (EGD) WITH PROPOFOL;  Surgeon: Jeani Hawking, MD;  [facility];   Service: Gastroenterology;  Laterality: N/A;   NECK SURGERY     at [facility] to get cancer out of neck area from tonsils   POLYPECTOMY     colon   POLYPECTOMY  11/08/2021   Procedure: POLYPECTOMY;  Surgeon: Jeani Hawking, MD;  [facility];  Service: Endoscopy;;   POLYPECTOMY  01/02/2023   Procedure: POLYPECTOMY;  Surgeon: Jeani Hawking, MD;  [facility];  Service: Gastroenterology;;   RIGHT/LEFT HEART CATH AND CORONARY ANGIOGRAPHY N/A 08/25/2022   Procedure: RIGHT/LEFT HEART CATH AND CORONARY ANGIOGRAPHY;  Surgeon: Lyn Records, MD;  Location: [facility] INVASIVE CV LAB;  Service: Cardiovascular;  Laterality: N/A;   STOMACH SURGERY  1984   40 % of stomach removed due to a bleeding ulcer   TONSILLECTOMY      Allergies  Allergies  Allergen Reactions   Flomax [Tamsulosin]     Combined with keflex caused pt to passed out   Keflex [Cephalexin]     In combination with flomax caused pt to pass out    History of Present Illness    Miguel Bennett has a PMH of GERD, hypertension, neck tightness, bleeding ulcer, atrial fibrillation.  He was diagnosed with atrial fibrillation 6/23.  He was referred to Dr. Bjorn Pippin for management of pulmonary hypertension.  He was noted to have elevated RV pressure and volume on echocardiogram 10/23.  He underwent successful DCCV 06/22/2022.  He was placed on diltiazem and Eliquis.  He underwent repeat cardioversion 2/24.  He was seen in follow-up by Dr. Nelly Laurence on 03/12/2023.  During that time  he denied symptoms from atrial fibrillation and denied change in his endurance or fatigue level.  He did note some shortness of breath post cardioversion.  He denied palpitations.  He presents to the clinic today for follow-up evaluation and states***.  *** denies chest pain, shortness of breath, lower extremity edema, fatigue, palpitations, melena, hematuria, hemoptysis, diaphoresis, weakness, presyncope, syncope, orthopnea, and PND.  Paroxysmal atrial  fibrillation-heart rate today***.  Reports compliance with apixaban.  Denies bleeding issues.  Status DCCV 2/24. Continue diltiazem, apixaban Avoid triggers caffeine, chocolate, EtOH, dehydration etc.  Pulmonary hypertension, tricuspid regurgitation-no increased work of breathing.  Noted on echocardiogram 10/23.  Euvolemic. Continue lisinopril, hydrochlorothiazide Increase physical activity as tolerated Daily weights-contact office with a weight increase of 2 to 3 pounds overnight or 5 pounds in 1 week Plan for repeat echocardiogram  Essential hypertension-BP today***. Continue lisinopril, HCTZ, diltiazem Maintain blood pressure log  Hyperlipidemia-LDL*** Continue rosuvastatin High-fiber diet Increase physical activity as tolerated  Disposition: Follow-up with Dr. Bjorn Pippin or me in 6-9 months.  Home Medications    Prior to Admission medications   Medication Sig Start Date End Date Taking? Authorizing Provider  apixaban (ELIQUIS) 5 MG TABS tablet Take 1 tablet (5 mg total) by mouth 2 (two) times daily. Resume Eliquis at 9 PM tonight 08/25/22   Lyn Records, MD  calcium carbonate (TUMS - DOSED IN MG ELEMENTAL CALCIUM) 500 MG chewable tablet Chew 1 tablet by mouth daily as needed for indigestion or heartburn.    [provider]  Carboxymethylcellul-Glycerin (LUBRICATING EYE DROPS OP) Place 1 drop into both eyes 3 (three) times daily as needed (dry eyes).    [provider]  Cholecalciferol (VITAMIN D) 50 MCG (2000 UT) tablet Take 2,000 Units by mouth daily.     [provider]  Cyanocobalamin (VITAMIN B-12) 5000 MCG TBDP Take 5,000 mg by mouth daily.    [provider]  diclofenac Sodium (VOLTAREN) 1 % GEL Apply 2 g topically 2 (two) times daily as needed (pain). Applied to neck area 08/13/20   Kennon Portela, NP  diltiazem (CARDIZEM CD) 180 MG 24 hr capsule Take 360 mg by mouth in the morning. 05/18/22   [provider]  ferrous sulfate 325 (65 FE)  MG tablet Take 325 mg by mouth daily with breakfast.    [provider]  lisinopril-hydrochlorothiazide (ZESTORETIC) 20-12.5 MG tablet Take 0.5 tablets by mouth daily. 07/10/22   [provider]  omeprazole (PRILOSEC) 20 MG capsule Take 20 mg by mouth daily.    [provider]  rosuvastatin (CRESTOR) 5 MG tablet Take 1 tablet (5 mg total) by mouth daily. 09/20/22 09/15/23  Little Ishikawa, MD    Family History    No family history on file. has no family status information on file.   Social History    Social History   Socioeconomic History   Marital status: Married    Spouse name: Not on file   Number of children: Not on file   Years of education: Not on file   Highest education level: Not on file  Occupational History   Not on file  Tobacco Use   Smoking status: Former    Current packs/day: 0.00    Average packs/day: 1 pack/day for 12.0 years (12.0 ttl pk-yrs)    Types: Cigarettes    Start date: 41    Quit date: 37    Years since quitting: 43.6   Smokeless tobacco: Never  Vaping Use   Vaping status:  Never Used  Substance and Sexual Activity   Alcohol use: Not Currently    Alcohol/week: 20.0 standard drinks of alcohol    Types: 20 Standard drinks or equivalent per week    Comment: not in a couple of weeks. 06/17/19   Drug use: Never   Sexual activity: Not on file  Other Topics Concern   Not on file  Social History Narrative   Not on file   Social Determinants of Health   Financial Resource Strain: Not on file  Food Insecurity: No Food Insecurity (09/22/2022)   [facility] visits prior to 02/10/2023.   Hunger Vital Sign    Worried About Running Out of Food in the Last Year: Never true    Ran Out of Food in the Last Year: Never true  Transportation Needs: No Transportation Needs (09/22/2022)   Received from  [facility] visits prior to 02/10/2023., [facility] visits prior to 02/10/2023.   PRAPARE - Administrator, Civil Service (Medical): No    Lack of Transportation (Non-Medical): No  Physical Activity: Not on file  Stress: Not on file  Social Connections: Not on file  Intimate Partner Violence: Not At Risk (06/17/2019)   Humiliation, Afraid, Rape, and Kick questionnaire    Fear of Current or Ex-Partner: No    Emotionally Abused: No    Physically Abused: No    Sexually Abused: No     Review of Systems    General:  No chills, fever, night sweats or weight changes.  Cardiovascular:  No chest pain, dyspnea on exertion, edema, orthopnea, palpitations, paroxysmal nocturnal dyspnea. Dermatological: No rash, lesions/masses Respiratory: No cough, dyspnea Urologic: No hematuria, dysuria Abdominal:   No nausea, vomiting, diarrhea, bright red blood per rectum, melena, or hematemesis Neurologic:  No visual changes, wkns, changes in mental status. All other systems reviewed and are otherwise negative except as noted above.  Physical Exam    VS:  There were no vitals taken for this visit. , BMI There is no height or weight on file to calculate BMI. GEN: Well nourished, well developed, in no acute distress. HEENT: normal. Neck: Supple, no JVD, carotid bruits, or masses. Cardiac: RRR, no murmurs, rubs, or gallops. No clubbing, cyanosis, edema.  Radials/DP/PT 2+ and equal bilaterally.  Respiratory:  Respirations regular and unlabored, clear to auscultation bilaterally. GI: Soft, nontender, nondistended, BS + x 4. MS: no deformity or atrophy. Skin: warm and dry, no rash. Neuro:  Strength and sensation are intact. Psych: Normal affect.  Accessory Clinical Findings    Recent Labs: 08/21/2022: ALT 10; BNP 58.4 01/25/2023: BUN 12; Creatinine, Ser 1.06; Hemoglobin 15.8; Platelets 302; Potassium 4.6; Sodium 138   Recent  Lipid Panel    Component Value Date/Time   CHOL 208 (H) 09/14/2022 1533   TRIG 54 09/14/2022 1533   HDL 108 09/14/2022 1533   CHOLHDL 1.9 09/14/2022 1533   LDLCALC 90 09/14/2022 1533    No BP recorded.  {Refresh Note OR Click here to enter BP  :1}***    ECG personally reviewed by me today- ***    Echocardiogram 07/26/2022  ECHOCARDIOGRAM REPORT       Patient Name:   Miguel Bennett Date of Exam: 07/26/2022  Medical Rec #:  [id number]     Height:       72.0 in  Accession #:    [id number]    Weight:       145.6 lb  Date of Birth:  08/15/1951      BSA:          1.862 m  Patient Age:    71 years      BP:           113/78 mmHg  Patient Gender: M             HR:           50 bpm.  Exam Location:  Outpatient   Procedure: 2D Echo, 3D Echo, Color Doppler and Cardiac Doppler   Indications:    I48.91* Unspecified atrial fibrillation    History:        Patient has no prior history of Echocardiogram  examinations.                 Arrythmias:Atrial Fibrillation; Risk Factors:Hypertension.    Sonographer:    Irving Burton Senior RDCS  Sonographer#2:  Lucendia Herrlich  Referring Phys: [phone] DONNA C CARROLL   IMPRESSIONS     1. Left ventricular ejection fraction, by estimation, is 60 to 65%. The  left ventricle has normal function. The left ventricle has no regional  wall motion abnormalities. Left ventricular diastolic parameters are  indeterminate. There is the  interventricular septum is flattened in systole and diastole, consistent  with right ventricular pressure and volume overload.   2. Right ventricular systolic function is normal. The right ventricular  size is moderately enlarged. There is moderately elevated pulmonary artery  systolic pressure.   3. Left atrial size was mildly dilated.   4. Right atrial size was mildly dilated.   5. The mitral valve is normal in structure. Trivial mitral valve  regurgitation.   6. Tricuspid valve regurgitation is moderate to severe.   7. The  aortic valve is normal in structure. Aortic valve regurgitation is  not visualized.   8. There is borderline dilatation of the ascending aorta, measuring 37  mm.   9. Hepatic flow reversal assessment is challenging. The inferior vena  cava is normal in size with greater than 50% respiratory variability,  suggesting right atrial pressure of 3 mmHg.   Comparison(s): No prior Echocardiogram.   FINDINGS   Left Ventricle: Left ventricular ejection fraction, by estimation, is 60  to 65%. The left ventricle has normal function. The left ventricle has no  regional wall motion abnormalities. The left ventricular internal cavity  size was normal in size. There is   no left ventricular hypertrophy. The interventricular septum is flattened  in systole and diastole, consistent with right ventricular pressure and  volume overload. Left ventricular diastolic parameters are indeterminate.   Right Ventricle: The right ventricular size is moderately enlarged. No  increase in right ventricular wall thickness. Right ventricular systolic  function is normal. There is moderately elevated pulmonary artery systolic  pressure. The tricuspid regurgitant   velocity is 3.27 m/s, and with an assumed right atrial pressure of 3  mmHg, the estimated right ventricular systolic pressure is 45.8 mmHg.   Left Atrium: Left atrial size was mildly dilated.   Right Atrium: Right atrial size was mildly dilated.   Pericardium: There is no evidence of pericardial effusion.   Mitral Valve: The mitral valve is normal in structure. Trivial mitral  valve regurgitation.   Tricuspid Valve: The tricuspid valve is normal in structure. Tricuspid  valve regurgitation is moderate to severe.   Aortic Valve: The  aortic valve is normal in structure. Aortic valve  regurgitation is not visualized.   Pulmonic Valve: The pulmonic valve was normal in structure. Pulmonic valve  regurgitation is trivial.   Aorta: There is borderline  dilatation of the ascending aorta, measuring 37  mm.   Venous: Hepatic flow reversal assessment is challenging. The inferior vena  cava is normal in size with greater than 50% respiratory variability,  suggesting right atrial pressure of 3 mmHg.   IAS/Shunts: No atrial level shunt detected by color flow Doppler.       Right/ Left heart cath 08/25/2022  CONCLUSIONS: Normal right heart pressures without documentation of pulmonary hypertension. Nonobstructive coronary disease involving the LAD with tandem 40 and 60% stenoses in the proximal and mid respectively. Other coronaries are widely patent.   RECOMMENDATIONS: Aggressive risk factor modification Statin therapy should be started despite the patient's high HDL.  Diagnostic Dominance: Right  Intervention   Assessment & Plan   1.  ***   Thomasene Ripple.  NP-C     07/15/2023, 3:21 PM [facility] [address] Office [phone] Fax [phone]    I spent***minutes examining this patient, reviewing medications, and using patient centered shared decision making involving her cardiac care.  Prior to her visit I spent greater than 20 minutes reviewing her past medical history,  medications, and prior cardiac tests.

## 2023-07-17 ENCOUNTER — Ambulatory Visit: Payer: Medicare Other | Admitting: General Practice

## 2023-07-17 ENCOUNTER — Encounter: Payer: Self-pay | Admitting: General Practice

## 2023-07-17 VITALS — BP 102/62 | HR 56 | Ht 72.0 in | Wt 144.6 lb

## 2023-07-17 DIAGNOSIS — E785 Hyperlipidemia, unspecified: Secondary | ICD-10-CM

## 2023-07-17 DIAGNOSIS — I10 Essential (primary) hypertension: Secondary | ICD-10-CM

## 2023-07-17 DIAGNOSIS — I480 Paroxysmal atrial fibrillation: Secondary | ICD-10-CM

## 2023-07-17 DIAGNOSIS — I272 Pulmonary hypertension, unspecified: Secondary | ICD-10-CM | POA: Diagnosis not present

## 2023-07-17 NOTE — Patient Instructions (Signed)
Medication Instructions:  The current medical regimen is effective;  continue present plan and medications as directed. Please refer to the Current Medication list given to you today.  *If you need a refill on your cardiac medications before your next appointment, please call your pharmacy*  Lab Work: NONE If you have labs (blood work) drawn today and your tests are completely normal, you will receive your results only by:  MyChart Message (if you have MyChart) OR  A paper copy in the mail If you have any lab test that is abnormal or we need to change your treatment, we will call you to review the results.  Testing/Procedures: NONE  Other Instructions PLEASE READ AND FOLLOW ATTACHED High-fiber diet PLEASE INCREASE PHYSICAL ACTIVITY-AS TOLERATED  Follow-Up: At [facility], you and your health needs are our priority.  As part of our continuing mission to provide you with exceptional heart care, we have created designated Provider Care Teams.  These Care Teams include your primary Cardiologist (physician) and Advanced Practice Providers (APPs -  Physician Assistants and Nurse Practitioners) who all work together to provide you with the care you need, when you need it.  Your next appointment:   9 month(s)  Provider:   Little Ishikawa, MD        High-Fiber Eating Plan Fiber, also called dietary fiber, is found in foods such as fruits, vegetables, whole grains, and beans. A high-fiber diet can be good for your health. Your health care provider may recommend a high-fiber diet to help: Prevent trouble pooping (constipation). Lower your cholesterol. Treat the following conditions: Hemorrhoids. This is inflammation of veins in the anus. Inflammation of specific areas of the digestive tract. Irritable bowel syndrome (IBS). This is a problem of the large intestine, also called the colon, that sometimes causes belly pain and bloating. Prevent overeating as part of a  weight-loss plan. Lower the risk of heart disease, type 2 diabetes, and certain cancers. What are tips for following this plan? Reading food labels  Check the nutrition facts label on foods for the amount of dietary fiber. Choose foods that have 4 grams of fiber or more per serving. The recommended goals for how much fiber you should eat each day include: Males 41 years old or younger: 30-34 g. Males over 108 years old: 28-34 g. Females 86 years old or younger: 25-28 g. Females over 16 years old: 22-25 g. Your daily fiber goal is _____________ g. Shopping Choose whole fruits and vegetables instead of processed. For example, choose apples instead of apple juice or applesauce. Choose a variety of high-fiber foods such as avocados, lentils, oats, and pinto beans. Read the nutrition facts label on foods. Check for foods with added fiber. These foods often have high sugar and salt (sodium) amounts per serving. Cooking Use whole-grain flour for baking and cooking. Cook with brown rice instead of white rice. Make meals that have a lot of beans and vegetables in them, such as chili or vegetable-based soups. Meal planning Start the day with a breakfast that is high in fiber, such as a cereal that has 5 g of fiber or more per serving. Eat breads and cereals that are made with whole-grain flour instead of refined flour or white flour. Eat brown rice, bulgur wheat, or millet instead of white rice. Use beans in place of meat in soups, salads, and pasta dishes. Be sure that half of the grains you eat each day are whole grains. General information You can get the  recommended amount of dietary fiber by: Eating a variety of fruits, vegetables, grains, nuts, and beans. Taking a fiber supplement if you aren't able to eat enough fiber. It's better to get fiber through food than from a supplement. Slowly increase how much fiber you eat. If you increase the amount of fiber you eat too quickly, you may have  bloating, cramping, or gas. Drink plenty of water to help you digest fiber. Choose high-fiber snacks, such as berries, raw vegetables, nuts, and popcorn. What foods should I eat? Fruits Berries. Pears. Apples. Oranges. Avocado. Prunes and raisins. Dried figs. Vegetables Sweet potatoes. Spinach. Kale. Artichokes. Cabbage. Broccoli. Cauliflower. Green peas. Carrots. Squash. Grains Whole-grain breads. Multigrain cereal. Oats and oatmeal. Brown rice. Barley. Bulgur wheat. Millet. Quinoa. Bran muffins. Popcorn. Rye wafer crackers. Meats and other proteins Navy beans, kidney beans, and pinto beans. Soybeans. Split peas. Lentils. Nuts and seeds. Dairy Fiber-fortified yogurt. Fortified means that fiber has been added to the product. Beverages Fiber-fortified soy milk. Fiber-fortified orange juice. Other foods Fiber bars. The items listed above may not be all the foods and drinks you can have. Talk to a dietitian to learn more. What foods should I avoid? Fruits Fruit juice. Cooked, strained fruit. Vegetables Fried potatoes. Canned vegetables. Well-cooked vegetables. Grains White bread. Pasta made with refined flour. White rice. Meats and other proteins Fatty meat. Fried chicken or fried fish. Dairy Milk. Cream cheese. Sour cream. Fats and oils Butters. Beverages Soft drinks. Other foods Cakes and pastries. The items listed above may not be all the foods and drinks you should avoid. Talk to a dietitian to learn more. This information is not intended to replace advice given to you by your health care provider. Make sure you discuss any questions you have with your health care provider. Document Revised: 02/19/2023 Document Reviewed: 02/19/2023 Elsevier Patient Education  2024 [employer].

## 2023-07-17 NOTE — Progress Notes (Signed)
Cardiology Clinic Note   Patient Name: Miguel Bennett Date of Encounter: 07/17/2023  Primary Care Provider:  Barbie Banner, MD Primary Cardiologist:  Little Ishikawa, MD  Patient Profile    Miguel Bennett 72 year old male presents the clinic today for follow-up evaluation of his hypertension, coronary artery disease, and atrial fibrillation.  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   Past Surgical History:  Procedure Laterality Date   BACK SURGERY     CARDIOVERSION N/A 06/22/2022   Procedure: CARDIOVERSION;  Surgeon: Jodelle Red, MD;  Location: [facility] ENDOSCOPY;  Service: Cardiovascular;  Laterality: N/A;   CARDIOVERSION N/A 01/31/2023   Procedure: CARDIOVERSION;  Surgeon: Little Ishikawa, MD;  Location: [facility] ENDOSCOPY;  Service: Cardiovascular;  Laterality: N/A;   CATARACT EXTRACTION W/ INTRAOCULAR LENS  IMPLANT, BILATERAL     CHOLECYSTECTOMY     COLONOSCOPY     COLONOSCOPY WITH PROPOFOL N/A 11/08/2021   Procedure: COLONOSCOPY WITH PROPOFOL;  Surgeon: Jeani Hawking, MD;  Location: [facility] ENDOSCOPY;  Service: Endoscopy;  Laterality: N/A;   COLONOSCOPY WITH PROPOFOL N/A 01/02/2023   Procedure: COLONOSCOPY WITH PROPOFOL;  Surgeon: Jeani Hawking, MD;  Location: [facility] ENDOSCOPY;  Service: Gastroenterology;  Laterality: N/A;   DIRECT LARYNGOSCOPY Right 03/18/2019   Procedure: DIRECT LARYNGOSCOPY WITH BIOPSY OF RIGHT TONSILLAR MASS;  Surgeon: Suzanna Obey, MD;  Location[facility] OR;  Service: ENT;  Laterality: Right;   ESOPHAGOGASTRODUODENOSCOPY (EGD) WITH PROPOFOL N/A 01/02/2023   Procedure: ESOPHAGOGASTRODUODENOSCOPY (EGD) WITH PROPOFOL;  Surgeon: Jeani Hawking, MD;  Location: [facility] ENDOSCOPY;   Service: Gastroenterology;  Laterality: N/A;   NECK SURGERY     at [facility] to get cancer out of neck area from tonsils   POLYPECTOMY     colon   POLYPECTOMY  11/08/2021   Procedure: POLYPECTOMY;  Surgeon: Jeani Hawking, MD;  Location: [facility] ENDOSCOPY;  Service: Endoscopy;;   POLYPECTOMY  01/02/2023   Procedure: POLYPECTOMY;  Surgeon: Jeani Hawking, MD;  Location: [facility] ENDOSCOPY;  Service: Gastroenterology;;   RIGHT/LEFT HEART CATH AND CORONARY ANGIOGRAPHY N/A 08/25/2022   Procedure: RIGHT/LEFT HEART CATH AND CORONARY ANGIOGRAPHY;  Surgeon: Lyn Records, MD;  Location: [facility] INVASIVE CV LAB;  Service: Cardiovascular;  Laterality: N/A;   STOMACH SURGERY  1984   40 % of stomach removed due to a bleeding ulcer   TONSILLECTOMY      Allergies  Allergies  Allergen Reactions   Flomax [Tamsulosin]     Combined with keflex caused pt to passed out   Keflex [Cephalexin]     In combination with flomax caused pt to pass out    History of Present Illness    Miguel Bennett has a PMH of GERD, hypertension, neck tightness, bleeding ulcer, atrial fibrillation.  He was diagnosed with atrial fibrillation 6/23.  He was referred to Dr. Bjorn Pippin for management of pulmonary hypertension.  He was noted to have elevated RV pressure and volume on echocardiogram 10/23.  He underwent successful DCCV 06/22/2022.  He was placed on diltiazem and Eliquis.  He underwent repeat cardioversion 2/24.  He was seen in follow-up by Dr. Nelly Laurence on 03/12/2023.  During that time  he denied symptoms from atrial fibrillation and denied change in his endurance or fatigue level.  He did note some shortness of breath post cardioversion.  He denied palpitations.  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  He denies episodes of palpitations.  We reviewed his echocardiogram, cardioversions, and cardiac catheterization.  He expressed  understanding.  We reviewed his cholesterol and importance of high-fiber heart healthy diet.  He remained stable from a cardiac standpoint.  Will plan follow-up in 9 to 12 months.  Today he denies chest pain, shortness of breath, lower extremity edema, fatigue, palpitations, melena, hematuria, hemoptysis, diaphoresis, weakness, presyncope, syncope, orthopnea, and PND.    Home Medications    Prior to Admission medications   Medication Sig Start Date End Date Taking? Authorizing Provider  apixaban (ELIQUIS) 5 MG TABS tablet Take 1 tablet (5 mg total) by mouth 2 (two) times daily. Resume Eliquis at 9 PM tonight 08/25/22   Lyn Records, MD  calcium carbonate (TUMS - DOSED IN MG ELEMENTAL CALCIUM) 500 MG chewable tablet Chew 1 tablet by mouth daily as needed for indigestion or heartburn.    [provider]  Carboxymethylcellul-Glycerin (LUBRICATING EYE DROPS OP) Place 1 drop into both eyes 3 (three) times daily as needed (dry eyes).    [provider]  Cholecalciferol (VITAMIN D) 50 MCG (2000 UT) tablet Take 2,000 Units by mouth daily.     [provider]  Cyanocobalamin (VITAMIN B-12) 5000 MCG TBDP Take 5,000 mg by mouth daily.    [provider]  diclofenac Sodium (VOLTAREN) 1 % GEL Apply 2 g topically 2 (two) times daily as needed (pain). Applied to neck area 08/13/20   Kennon Portela, NP  diltiazem (CARDIZEM CD) 180 MG 24 hr capsule Take 360 mg by mouth in the morning. 05/18/22   [provider]  ferrous sulfate 325 (65 FE) MG tablet Take 325 mg by mouth daily with breakfast.    [provider]  lisinopril-hydrochlorothiazide (ZESTORETIC) 20-12.5 MG tablet Take 0.5 tablets by mouth daily. 07/10/22   [provider]  omeprazole (PRILOSEC) 20 MG capsule Take 20 mg by mouth daily.    [provider]  rosuvastatin (CRESTOR) 5 MG tablet Take 1 tablet (5 mg total) by mouth daily. 09/20/22 09/15/23  Little Ishikawa, MD    Family  History    History reviewed. No pertinent family history. has no family status information on file.   Social History    Social History   Socioeconomic History   Marital status: Married    Spouse name: Not on file   Number of children: Not on file   Years of education: Not on file   Highest education level: Not on file  Occupational History   Not on file  Tobacco Use   Smoking status: Former    Current packs/day: 0.00    Average packs/day: 1 pack/day for 12.0 years (12.0 ttl pk-yrs)    Types: Cigarettes    Start date: 65    Quit date: 27    Years since quitting: 43.6   Smokeless tobacco: Never  Vaping Use   Vaping status: Never Used  Substance and Sexual Activity   Alcohol use: Not Currently    Alcohol/week: 20.0 standard drinks of alcohol    Types: 20 Standard drinks or equivalent per week    Comment: not in a couple of weeks. 06/17/19  Drug use: Never   Sexual activity: Not on file  Other Topics Concern   Not on file  Social History Narrative   Not on file   Social Determinants of Health   Financial Resource Strain: Not on file  Food Insecurity: No Food Insecurity (09/22/2022)   [facility] to [facility]10[facility], [facility] visits prior to 02/10/2023., [facility]   Hunger Vital Sign    Worried About Running Out of Food in the Last Year: Never true    Ran Out of Food in the Last Year: Never true  Transportation Needs: No Transportation Needs (09/22/2022)   [facility] from [facility] visits prior to 02/10/2023., [facility], [facility] visits prior to 02/10/2023., [facility]   PRAPARE - Transportation    Lack of Transportation (Medical): No    Lack of Transportation (Non-Medical): No  Physical Activity: Not on file  Stress: Not on file  Social Connections: Not on file  Intimate Partner Violence: Not At Risk (06/17/2019)   Humiliation,  Afraid, Rape, and Kick questionnaire    Fear of Current or Ex-Partner: No    Emotionally Abused: No    Physically Abused: No    Sexually Abused: No     Review of Systems    General:  No chills, fever, night sweats or weight changes.  Cardiovascular:  No chest pain, dyspnea on exertion, edema, orthopnea, palpitations, paroxysmal nocturnal dyspnea. Dermatological: No rash, lesions/masses Respiratory: No cough, dyspnea Urologic: No hematuria, dysuria Abdominal:   No nausea, vomiting, diarrhea, bright red blood per rectum, melena, or hematemesis Neurologic:  No visual changes, wkns, changes in mental status. All other systems reviewed and are otherwise negative except as noted above.  Physical Exam    VS:  BP 102/62   Pulse (!) 56   Ht 6' (1.829 m)   Wt 144 lb 9.6 oz (65.6 kg)   SpO2 95%   BMI 19.61 kg/m  , BMI Body mass index is 19.61 kg/m. GEN: Well nourished, well developed, in no acute distress. HEENT: normal. Neck: Supple, no JVD, carotid bruits, or masses. Cardiac: RRR, no murmurs, rubs, or gallops. No clubbing, cyanosis, edema.  Radials/DP/PT 2+ and equal bilaterally.  Respiratory:  Respirations regular and unlabored, clear to auscultation bilaterally. GI: Soft, nontender, nondistended, BS + x 4. MS: no deformity or atrophy. Skin: warm and dry, no rash. Neuro:  Strength and sensation are intact. Psych: Normal affect.  Accessory Clinical Findings    Recent Labs: 08/21/2022: ALT 10; BNP 58.4 01/25/2023: BUN 12; Creatinine, Ser 1.06; Hemoglobin 15.8; Platelets 302; Potassium 4.6; Sodium 138   Recent Lipid Panel    Component Value Date/Time   CHOL 208 (H) 09/14/2022 1533   TRIG 54 09/14/2022 1533   HDL 108 09/14/2022 1533   CHOLHDL 1.9 09/14/2022 1533   LDLCALC 90 09/14/2022 1533         ECG personally reviewed by me today-none today.    Echocardiogram 07/26/2022  ECHOCARDIOGRAM REPORT       Patient Name:   Miguel Bennett Date of Exam: 07/26/2022  Medical  Rec #:  [id number]     Height:       72.0 in  Accession #:    [id number]    Weight:       145.6 lb  Date of Birth:  1951/10/18      BSA:  1.862 m  Patient Age:    71 years      BP:           113/78 mmHg  Patient Gender: M             HR:           50 bpm.  Exam Location:  Outpatient   Procedure: 2D Echo, 3D Echo, Color Doppler and Cardiac Doppler   Indications:    I48.91* Unspecified atrial fibrillation    History:        Patient has no prior history of Echocardiogram  examinations.                 Arrythmias:Atrial Fibrillation; Risk Factors:Hypertension.    Sonographer:    Irving Burton Senior RDCS  Sonographer#2:  Lucendia Herrlich  Referring Phys: [phone] DONNA C CARROLL   IMPRESSIONS     1. Left ventricular ejection fraction, by estimation, is 60 to 65%. The  left ventricle has normal function. The left ventricle has no regional  wall motion abnormalities. Left ventricular diastolic parameters are  indeterminate. There is the  interventricular septum is flattened in systole and diastole, consistent  with right ventricular pressure and volume overload.   2. Right ventricular systolic function is normal. The right ventricular  size is moderately enlarged. There is moderately elevated pulmonary artery  systolic pressure.   3. Left atrial size was mildly dilated.   4. Right atrial size was mildly dilated.   5. The mitral valve is normal in structure. Trivial mitral valve  regurgitation.   6. Tricuspid valve regurgitation is moderate to severe.   7. The aortic valve is normal in structure. Aortic valve regurgitation is  not visualized.   8. There is borderline dilatation of the ascending aorta, measuring 37  mm.   9. Hepatic flow reversal assessment is challenging. The inferior vena  cava is normal in size with greater than 50% respiratory variability,  suggesting right atrial pressure of 3 mmHg.   Comparison(s): No prior Echocardiogram.   FINDINGS   Left Ventricle: Left  ventricular ejection fraction, by estimation, is 60  to 65%. The left ventricle has normal function. The left ventricle has no  regional wall motion abnormalities. The left ventricular internal cavity  size was normal in size. There is   no left ventricular hypertrophy. The interventricular septum is flattened  in systole and diastole, consistent with right ventricular pressure and  volume overload. Left ventricular diastolic parameters are indeterminate.   Right Ventricle: The right ventricular size is moderately enlarged. No  increase in right ventricular wall thickness. Right ventricular systolic  function is normal. There is moderately elevated pulmonary artery systolic  pressure. The tricuspid regurgitant   velocity is 3.27 m/s, and with an assumed right atrial pressure of 3  mmHg, the estimated right ventricular systolic pressure is 45.8 mmHg.   Left Atrium: Left atrial size was mildly dilated.   Right Atrium: Right atrial size was mildly dilated.   Pericardium: There is no evidence of pericardial effusion.   Mitral Valve: The mitral valve is normal in structure. Trivial mitral  valve regurgitation.   Tricuspid Valve: The tricuspid valve is normal in structure. Tricuspid  valve regurgitation is moderate to severe.   Aortic Valve: The aortic valve is normal in structure. Aortic valve  regurgitation is not visualized.   Pulmonic Valve: The pulmonic valve was normal in structure. Pulmonic valve  regurgitation is trivial.   Aorta: There is borderline dilatation of the  ascending aorta, measuring 37  mm.   Venous: Hepatic flow reversal assessment is challenging. The inferior vena  cava is normal in size with greater than 50% respiratory variability,  suggesting right atrial pressure of 3 mmHg.   IAS/Shunts: No atrial level shunt detected by color flow Doppler.       Right/ Left heart cath 08/25/2022  CONCLUSIONS: Normal right heart pressures without documentation of  pulmonary hypertension. Nonobstructive coronary disease involving the LAD with tandem 40 and 60% stenoses in the proximal and mid respectively. Other coronaries are widely patent.   RECOMMENDATIONS: Aggressive risk factor modification Statin therapy should be started despite the patient's high HDL.  Diagnostic Dominance: Right  Intervention   Assessment & Plan   1.  Paroxysmal atrial fibrillation-heart rate today 56.  Reports compliance with apixaban.  Denies bleeding issues.  Status DCCV 2/24. Continue diltiazem, apixaban Avoid triggers caffeine, chocolate, EtOH, dehydration etc.  Pulmonary hypertension, tricuspid regurgitation-no increased work of breathing.  Noted on echocardiogram 10/23.  Euvolemic. Continue lisinopril, hydrochlorothiazide Increase physical activity as tolerated Daily weights-contact office with a weight increase of 2 to 3 pounds overnight or 5 pounds in 1 week Plan for repeat echocardiogram  Essential hypertension-BP today 102/62. Continue lisinopril, HCTZ, diltiazem Maintain blood pressure log  Hyperlipidemia-LDL 90 on 09/14/22 Continue rosuvastatin High-fiber diet Increase physical activity as tolerated  Disposition: Follow-up with Dr. Bjorn Pippin or me in 9-12 months.   Thomasene Ripple.  NP-C     07/17/2023, 1:51 PM [facility] [address] Office [phone] Fax [phone]    I spent 14 minutes examining this patient, reviewing medications, and using patient centered shared decision making involving her cardiac care.  Prior to her visit I spent greater than 20 minutes reviewing her past medical history,  medications, and prior cardiac tests.

## 2023-08-28 ENCOUNTER — Other Ambulatory Visit: Payer: Self-pay | Admitting: Cardiology

## 2023-09-18 ENCOUNTER — Telehealth: Payer: Self-pay | Admitting: Cardiology

## 2023-09-18 DIAGNOSIS — I480 Paroxysmal atrial fibrillation: Secondary | ICD-10-CM

## 2023-09-18 MED ORDER — APIXABAN 5 MG PO TABS: 5.0000 mg | ORAL_TABLET | Freq: Two times a day (BID) | ORAL | 1 refills | Status: DC

## 2023-09-18 NOTE — Telephone Encounter (Signed)
Prescription refill request for Eliquis received. Indication: Afib  Last office visit: [place] (07/17/23)  Scr: 1.06 (01/25/23)  Age: 72 Weight: 65.6kg  Appropriate dose. Refill sent.

## 2023-09-18 NOTE — Telephone Encounter (Signed)
*  STAT* If patient is at the pharmacy, call can be transferred to refill team.   1. Which medications need to be refilled? (please list name of each medication and dose if known) apixaban (ELIQUIS) 5 MG TABS tablet   2. Which pharmacy/location (including street and city if local pharmacy) is medication to be sent to?  [id number] - [place], [place] - [address]    3. Do they need a 30 day or 90 day supply? 90   Patient only has a few days left of this medication.

## 2023-09-19 NOTE — Telephone Encounter (Signed)
Called pt unable to leave message.

## 2023-09-19 NOTE — Telephone Encounter (Signed)
Pt c/o medication issue:  1. Name of Medication:   apixaban (ELIQUIS) 5 MG TABS tablet   2. How are you currently taking this medication (dosage and times per day)?   As prescribed  3. Are you having a reaction (difficulty breathing--STAT)?   4. What is your medication issue?    Wife stated patient is in the donut hole and cannot afford the increased price for this medication and wants to get assistance with this medication.

## 2023-09-19 NOTE — Telephone Encounter (Signed)
Left message for pt to call.

## 2023-09-20 NOTE — Telephone Encounter (Addendum)
Wife returned call and information given from pharmacy. Wife will call insurance company and call back.

## 2023-09-20 NOTE — Telephone Encounter (Addendum)
Spoke to pt's wife concerning his Eliquis. She reported that when she went to pick up prescription insurance would not cover mediation and his out of pocket cost was $500 and prior to that they had to pay $200. She stated they could not afford to keep paying that amount every refill. Advised her to call his insurance company to see what needs to be done to get medication covered, see if medication is on formulary, if they have a deductible that needs to be met and what the cost will be next refill. She will also see if Eliquis is not on formulary then which medication is. He was mailed out Patient Assistance paperwork to have if medication is not covered at all. She will call back later with information.

## 2023-09-20 NOTE — Telephone Encounter (Signed)
Patient has Medicare supplement through his former employer, looks like plan has 40% copy on tier 3 drugs (they don't have a formulary list, so this is my guess for Eliquis tier), with max of $250/month, $750/3 months.   Have him check his formulary to see if dabigatran 150 mg is on a lower tier, but otherwise, Patient assistance is all we have to offer.

## 2023-09-20 NOTE — Telephone Encounter (Signed)
Attempted to call pt and phone kept getting disconnected. Unable to leave message. Will try later.

## 2023-12-17 ENCOUNTER — Encounter: Payer: Self-pay | Admitting: Cardiology

## 2023-12-17 NOTE — Telephone Encounter (Signed)
 Patient identification verified by 2 forms. Bertina Cooks, RN    Called and spoke to patients wife Ronal Jenkins Ronal Jenkins states:   -Patient purchased the eliquis  prescription, the cost was [phone]  -would like to know if there is a cheaper medication available  Informed Ronal Jenkins:   -due to medicare, there is a $2000 out of pocket cost for medication covered by medicare part D   -can go to Medicare.gov to sign up for payment assistance, spreading cost over 12 months   -should contact medicare to see if eliquis  a covered under part D   -if eliquis  is not covered can contact office for assistance with patient assistance program application  Ronal Jenkins verbalized understanding, no questions at this time

## 2023-12-17 NOTE — Telephone Encounter (Signed)
 Please see documentation in alternate 1/6 mychart encounter

## 2024-03-10 ENCOUNTER — Other Ambulatory Visit: Payer: Self-pay | Admitting: Cardiology

## 2024-03-10 DIAGNOSIS — I480 Paroxysmal atrial fibrillation: Secondary | ICD-10-CM

## 2024-03-10 NOTE — Telephone Encounter (Signed)
 Prescription refill request for Eliquis received. Indication: AF Last office visit: 07/17/23  Sherlean Foot NP Scr: 0.97 on 01/08/24  Epic Age: 73 Weight: 65.6kg   Based on above findings Eliquis 5mg  twice daily is the appropriate dose.  Refill approved.

## 2024-03-14 IMAGING — DX DG CHEST 1V PORT
1 series · 1 of 1 positions shown · non-contrast
Comparison: 01/11/2021

CLINICAL DATA: Cardiac arrhythmia, dizziness

EXAM:
PORTABLE CHEST 1 VIEW

[chest]
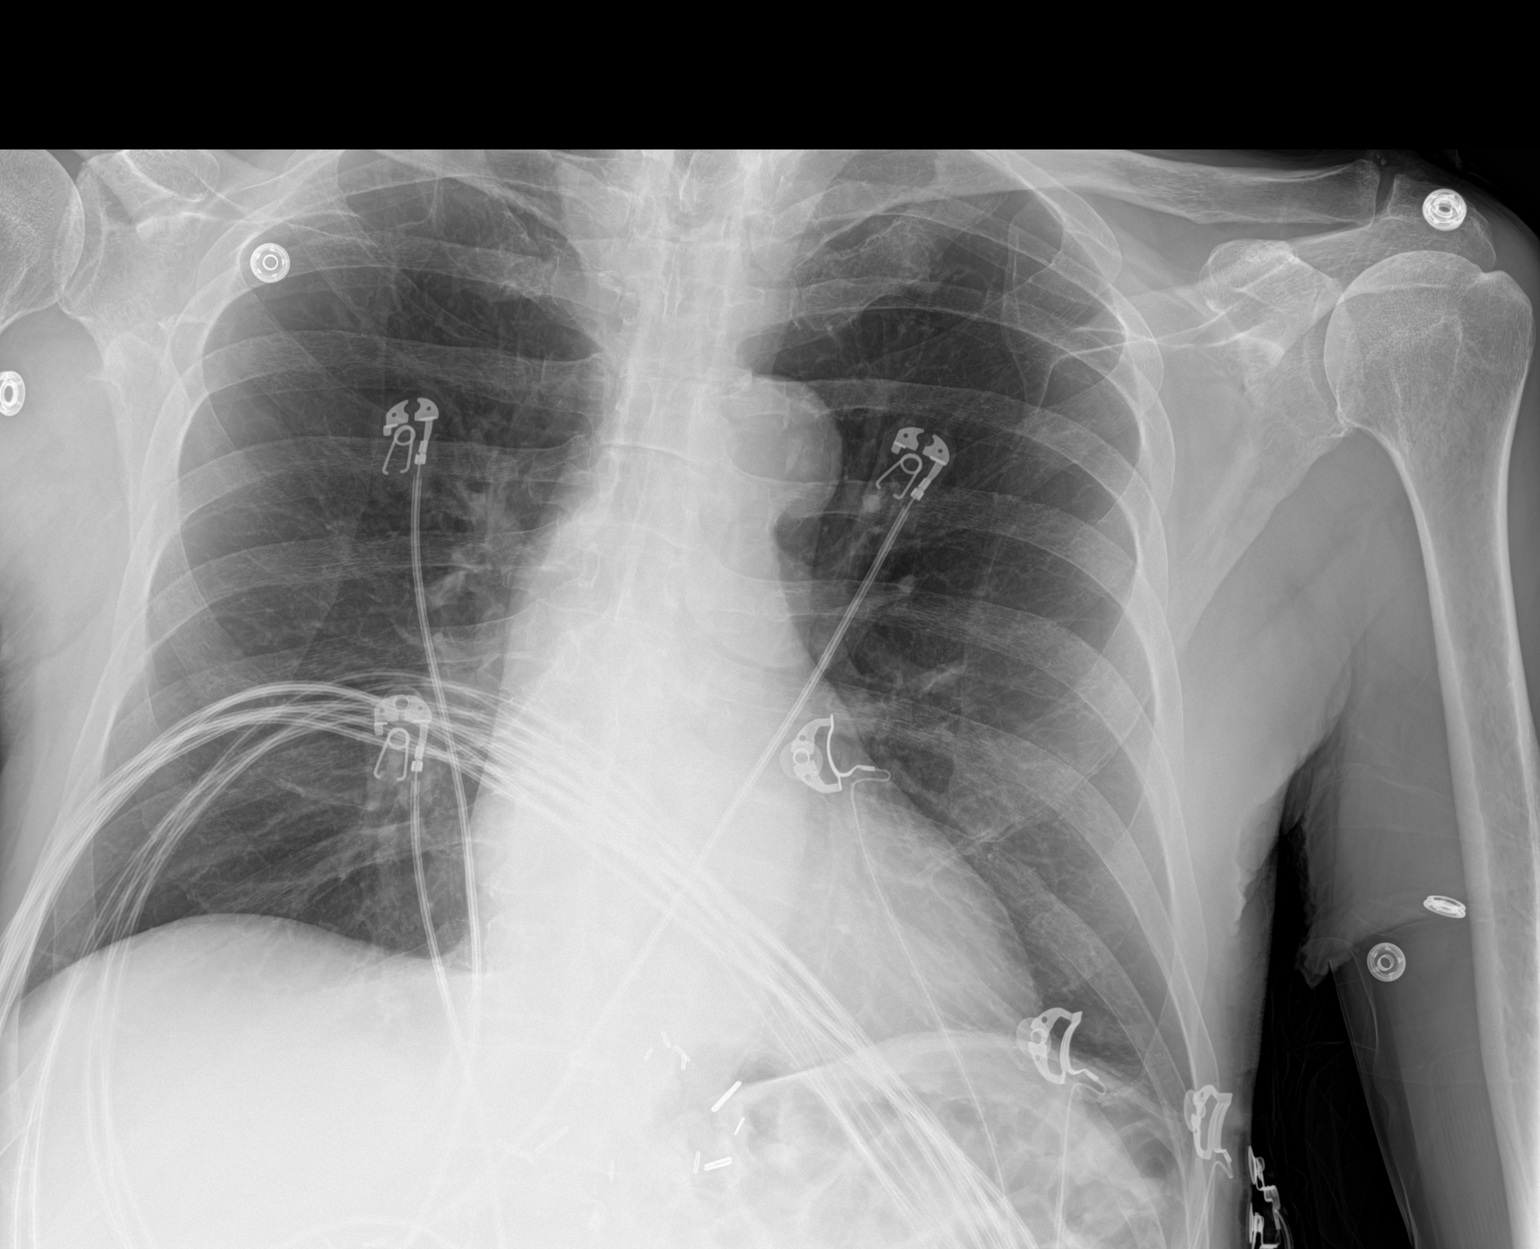

[1 of 1 positions shown; findings below may reference images not displayed]

FINDINGS: Cardiac size is within normal limits. Lung fields are clear of any
infiltrates or pulmonary edema. There is no pleural effusion or
pneumothorax. Surgical clips are noted adjacent to the
gastroesophageal junction.
IMPRESSION: No active disease.

## 2024-04-16 ENCOUNTER — Ambulatory Visit: Payer: Medicare Other | Admitting: Cardiology

## 2024-05-26 NOTE — Progress Notes (Unsigned)
 Cardiology Clinic Note   Patient Name: Miguel Bennett Date of Encounter: 05/27/2024  Primary Care Provider:  Tura Gaines, MD Primary Cardiologist:  Wendie Hamburg, MD  Patient Profile    Miguel Bennett 73 year old male presents the clinic today for follow-up evaluation of his hypertension, coronary artery disease, and atrial fibrillation.  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   Past Surgical History:  Procedure Laterality Date   BACK SURGERY     CARDIOVERSION N/A 06/22/2022   Procedure: CARDIOVERSION;  Surgeon: Sheryle Donning, MD;  Location: [facility] ENDOSCOPY;  Service: Cardiovascular;  Laterality: N/A;   CARDIOVERSION N/A 01/31/2023   Procedure: CARDIOVERSION;  Surgeon: Wendie Hamburg, MD;  Location: [facility] ENDOSCOPY;  Service: Cardiovascular;  Laterality: N/A;   CATARACT EXTRACTION W/ INTRAOCULAR LENS  IMPLANT, BILATERAL     CHOLECYSTECTOMY     COLONOSCOPY     COLONOSCOPY WITH PROPOFOL  N/A 11/08/2021   Procedure: COLONOSCOPY WITH PROPOFOL ;  Surgeon: Alvis Jourdain, MD;  Location: [facility] ENDOSCOPY;  Service: Endoscopy;  Laterality: N/A;   COLONOSCOPY WITH PROPOFOL  N/A 01/02/2023   Procedure: COLONOSCOPY WITH PROPOFOL ;  Surgeon: Alvis Jourdain, MD;  Location: [facility] ENDOSCOPY;  Service: Gastroenterology;  Laterality: N/A;   DIRECT LARYNGOSCOPY Right 03/18/2019   Procedure: DIRECT LARYNGOSCOPY WITH BIOPSY OF RIGHT TONSILLAR MASS;  Surgeon: Vernadine Golas, MD;  Location: [facility] OR;  Service: ENT;  Laterality: Right;   ESOPHAGOGASTRODUODENOSCOPY (EGD) WITH PROPOFOL  N/A 01/02/2023   Procedure: ESOPHAGOGASTRODUODENOSCOPY (EGD) WITH PROPOFOL ;  Surgeon: Alvis Jourdain, MD;  Location: [facility] ENDOSCOPY;   Service: Gastroenterology;  Laterality: N/A;   NECK SURGERY     at [facility] to get cancer out of neck area from tonsils   POLYPECTOMY     colon   POLYPECTOMY  11/08/2021   Procedure: POLYPECTOMY;  Surgeon: Alvis Jourdain, MD;  Location: [facility] ENDOSCOPY;  Service: Endoscopy;;   POLYPECTOMY  01/02/2023   Procedure: POLYPECTOMY;  Surgeon: Alvis Jourdain, MD;  Location: [facility] ENDOSCOPY;  Service: Gastroenterology;;   RIGHT/LEFT HEART CATH AND CORONARY ANGIOGRAPHY N/A 08/25/2022   Procedure: RIGHT/LEFT HEART CATH AND CORONARY ANGIOGRAPHY;  Surgeon: Arty Binning, MD;  Location: [facility] INVASIVE CV LAB;  Service: Cardiovascular;  Laterality: N/A;   STOMACH SURGERY  1984   40 % of stomach removed due to a bleeding ulcer   TONSILLECTOMY      Allergies  Allergies  Allergen Reactions   Flomax  [Tamsulosin ]     Combined with keflex  caused pt to passed out   Keflex  [Cephalexin ]     In combination with flomax  caused pt to pass out    History of Present Illness    Miguel Bennett has a PMH of GERD, hypertension, neck tightness, bleeding ulcer, atrial fibrillation.  He was diagnosed with atrial fibrillation 6/23.  He was referred to Dr. Alda Amas for management of pulmonary hypertension.  He was noted to have elevated RV pressure and volume on echocardiogram 10/23.  He underwent successful DCCV 06/22/2022.  He was placed on diltiazem  and Eliquis .  He underwent repeat cardioversion 2/24.  He was seen in follow-up by Dr. Arlester Ladd on 03/12/2023.  During that time  he denied symptoms from atrial fibrillation and denied change in his endurance or fatigue level.  He did note some shortness of breath post cardioversion.  He denied palpitations.  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  He denied episodes of palpitations.  We reviewed his echocardiogram, cardioversions, and cardiac catheterization.  He expressed  understanding.  We reviewed his cholesterol and importance of high-fiber heart healthy diet.  He remained stable from a cardiac standpoint.   Follow-up in 9 to 12 months was planned.   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  He is hoping for more hay throughout the season.  He brings in a article about cholesterol.  We reviewed this.  We reviewed that his previous cholesterol was 90.  He will repeat fasting lipids and LFTs with his health check with his PCP.  He continues to eat a heart healthy diet.  We reviewed his previous cardiac catheterization.  He expressed understanding.  Will plan follow-up in 12 months.  Today he denies chest pain, shortness of breath, lower extremity edema, fatigue, palpitations, melena, hematuria, hemoptysis, diaphoresis, weakness, presyncope, syncope, orthopnea, and PND.    Home Medications    Prior to Admission medications   Medication Sig Start Date End Date Taking? Authorizing Provider  apixaban  (ELIQUIS ) 5 MG TABS tablet Take 1 tablet (5 mg total) by mouth 2 (two) times daily. Resume Eliquis  at 9 PM tonight 08/25/22   Arty Binning, MD  calcium  carbonate (TUMS - DOSED IN MG ELEMENTAL CALCIUM ) 500 MG chewable tablet Chew 1 tablet by mouth daily as needed for indigestion or heartburn.    [provider]  Carboxymethylcellul-Glycerin  (LUBRICATING EYE DROPS OP) Place 1 drop into both eyes 3 (three) times daily as needed (dry eyes).    [provider]  Cholecalciferol (VITAMIN D) 50 MCG (2000 UT) tablet Take 2,000 Units by mouth daily.     [provider]  Cyanocobalamin (VITAMIN B-12) 5000 MCG TBDP Take 5,000 mg by mouth daily.    [provider]  diclofenac  Sodium (VOLTAREN ) 1 % GEL Apply 2 g topically 2 (two) times daily as needed (pain). Applied to neck area 08/13/20   Demaris Fillers, NP   diltiazem  (CARDIZEM  CD) 180 MG 24 hr capsule Take 360 mg by mouth in the morning. 05/18/22   [provider]  ferrous sulfate 325 (65 FE) MG tablet Take 325 mg by mouth daily with breakfast.    [provider]  lisinopril -hydrochlorothiazide  (ZESTORETIC) 20-12.5 MG tablet Take 0.5 tablets by mouth daily. 07/10/22   [provider]  omeprazole (PRILOSEC) 20 MG capsule Take 20 mg by mouth daily.    [provider]  rosuvastatin  (CRESTOR ) 5 MG tablet Take 1 tablet (5 mg total) by mouth daily. 09/20/22 09/15/23  Wendie Hamburg, MD    Family History    No family history on file. has no family status information on file.   Social History    Social History   Socioeconomic History   Marital status: Married    Spouse name: Not on file   Number of children: Not on file   Years of education: Not on file   Highest education level: Not on file  Occupational History   Not on  file  Tobacco Use   Smoking status: Former    Current packs/day: 0.00    Average packs/day: 1 pack/day for 12.0 years (12.0 ttl pk-yrs)    Types: Cigarettes    Start date: 57    Quit date: 53    Years since quitting: 44.4   Smokeless tobacco: Never  Vaping Use   Vaping status: Never Used  Substance and Sexual Activity   Alcohol use: Not Currently    Alcohol/week: 20.0 standard drinks of alcohol    Types: 20 Standard drinks or equivalent per week    Comment: not in a couple of weeks. 06/17/19   Drug use: Never   Sexual activity: Not on file  Other Topics Concern   Not on file  Social History Narrative   Not on file   Social Drivers of Health   Financial Resource Strain: Not on file  Food Insecurity: Low Risk  (05/21/2024)   Received from [facility]   Hunger Vital Sign    Within the past 12 months, you worried that your food would run out before you got money to buy more: Never true    Within the past 12 months, the food you bought just didn't last and you  didn't have money to get more. : Never true  Transportation Needs: No Transportation Needs (05/21/2024)   Received from [facility]    In the past 12 months, has lack of reliable transportation kept you from medical appointments, meetings, work or from getting things needed for daily living? : No  Physical Activity: Not on file  Stress: Not on file  Social Connections: Not on file  Intimate Partner Violence: Not At Risk (06/17/2019)   Humiliation, Afraid, Rape, and Kick questionnaire    Fear of Current or Ex-Partner: No    Emotionally Abused: No    Physically Abused: No    Sexually Abused: No     Review of Systems    General:  No chills, fever, night sweats or weight changes.  Cardiovascular:  No chest pain, dyspnea on exertion, edema, orthopnea, palpitations, paroxysmal nocturnal dyspnea. Dermatological: No rash, lesions/masses Respiratory: No cough, dyspnea Urologic: No hematuria, dysuria Abdominal:   No nausea, vomiting, diarrhea, bright red blood per rectum, melena, or hematemesis Neurologic:  No visual changes, wkns, changes in mental status. All other systems reviewed and are otherwise negative except as noted above.  Physical Exam    VS:  BP 110/70   Pulse (!) 49   Ht 6' (1.829 m)   Wt 137 lb (62.1 kg)   SpO2 96%   BMI 18.58 kg/m  , BMI Body mass index is 18.58 kg/m. GEN: Well nourished, well developed, in no acute distress. HEENT: normal. Neck: Supple, no JVD, carotid bruits, or masses. Cardiac: RRR, no murmurs, rubs, or gallops. No clubbing, cyanosis, edema.  Radials/DP/PT 2+ and equal bilaterally.  Respiratory:  Respirations regular and unlabored, clear to auscultation bilaterally. GI: Soft, nontender, nondistended, BS + x 4. MS: no deformity or atrophy. Skin: warm and dry, no rash. Neuro:  Strength and sensation are intact. Psych: Normal affect.  Accessory Clinical Findings    Recent Labs: No results found for requested labs within last  365 days.   Recent Lipid Panel    Component Value Date/Time   CHOL 208 (H) 09/14/2022 1533   TRIG 54 09/14/2022 1533   HDL 108 09/14/2022 1533   CHOLHDL 1.9 09/14/2022 1533   LDLCALC 90 09/14/2022 1533  ECG personally reviewed by me today-none today.EKG Interpretation Date/Time:  Tuesday May 27 2024 11:13:02 EDT Ventricular Rate:  49 PR Interval:  132 QRS Duration:  88 QT Interval:  452 QTC Calculation: 408 R Axis:   57  Text Interpretation: Sinus bradycardia When compared with ECG of 31-Jan-2023 10:22, No significant change was found Confirmed by Lawana Pray [phone]) on 05/27/2024 11:15:14 AM   Echocardiogram 07/26/2022  ECHOCARDIOGRAM REPORT       Patient Name:   FREDERIK STANDLEY Date of Exam: 07/26/2022  Medical Rec #:  [id number]     Height:       72.0 in  Accession #:    [id number]    Weight:       145.6 lb  Date of Birth:  1951/05/24      BSA:          1.862 m  Patient Age:    71 years      BP:           113/78 mmHg  Patient Gender: M             HR:           50 bpm.  Exam Location:  Outpatient   Procedure: 2D Echo, 3D Echo, Color Doppler and Cardiac Doppler   Indications:    I48.91* Unspecified atrial fibrillation    History:        Patient has no prior history of Echocardiogram  examinations.                 Arrythmias:Atrial Fibrillation; Risk Factors:Hypertension.    Sonographer:    Sherline Distel Senior RDCS  Sonographer#2:  Terrilee Few  Referring Phys: [phone] DONNA C CARROLL   IMPRESSIONS     1. Left ventricular ejection fraction, by estimation, is 60 to 65%. The  left ventricle has normal function. The left ventricle has no regional  wall motion abnormalities. Left ventricular diastolic parameters are  indeterminate. There is the  interventricular septum is flattened in systole and diastole, consistent  with right ventricular pressure and volume overload.   2. Right ventricular systolic function is normal. The right ventricular  size is  moderately enlarged. There is moderately elevated pulmonary artery  systolic pressure.   3. Left atrial size was mildly dilated.   4. Right atrial size was mildly dilated.   5. The mitral valve is normal in structure. Trivial mitral valve  regurgitation.   6. Tricuspid valve regurgitation is moderate to severe.   7. The aortic valve is normal in structure. Aortic valve regurgitation is  not visualized.   8. There is borderline dilatation of the ascending aorta, measuring 37  mm.   9. Hepatic flow reversal assessment is challenging. The inferior vena  cava is normal in size with greater than 50% respiratory variability,  suggesting right atrial pressure of 3 mmHg.   Comparison(s): No prior Echocardiogram.   FINDINGS   Left Ventricle: Left ventricular ejection fraction, by estimation, is 60  to 65%. The left ventricle has normal function. The left ventricle has no  regional wall motion abnormalities. The left ventricular internal cavity  size was normal in size. There is   no left ventricular hypertrophy. The interventricular septum is flattened  in systole and diastole, consistent with right ventricular pressure and  volume overload. Left ventricular diastolic parameters are indeterminate.   Right Ventricle: The right ventricular size is moderately enlarged. No  increase in right ventricular wall thickness. Right ventricular systolic  function  is normal. There is moderately elevated pulmonary artery systolic  pressure. The tricuspid regurgitant   velocity is 3.27 m/s, and with an assumed right atrial pressure of 3  mmHg, the estimated right ventricular systolic pressure is 45.8 mmHg.   Left Atrium: Left atrial size was mildly dilated.   Right Atrium: Right atrial size was mildly dilated.   Pericardium: There is no evidence of pericardial effusion.   Mitral Valve: The mitral valve is normal in structure. Trivial mitral  valve regurgitation.   Tricuspid Valve: The tricuspid  valve is normal in structure. Tricuspid  valve regurgitation is moderate to severe.   Aortic Valve: The aortic valve is normal in structure. Aortic valve  regurgitation is not visualized.   Pulmonic Valve: The pulmonic valve was normal in structure. Pulmonic valve  regurgitation is trivial.   Aorta: There is borderline dilatation of the ascending aorta, measuring 37  mm.   Venous: Hepatic flow reversal assessment is challenging. The inferior vena  cava is normal in size with greater than 50% respiratory variability,  suggesting right atrial pressure of 3 mmHg.   IAS/Shunts: No atrial level shunt detected by color flow Doppler.       Right/ Left heart cath 08/25/2022  CONCLUSIONS: Normal right heart pressures without documentation of pulmonary hypertension. Nonobstructive coronary disease involving the LAD with tandem 40 and 60% stenoses in the proximal and mid respectively. Other coronaries are widely patent.   RECOMMENDATIONS: Aggressive risk factor modification Statin therapy should be started despite the patient's high HDL.  Diagnostic Dominance: Right  Intervention   Assessment & Plan   1.  Paroxysmal atrial fibrillation-EKG today shows sinus brady 49 bpm.  Continues compliance with apixaban .  Denies bleeding issues or recent trauma.  Status DCCV 2/24. Continue diltiazem , apixaban    Coronary artery disease-noted to have nonobstructive coronary artery disease on cardiac catheterization 9/23.  He had 40% proximal and 60% mid LAD plaque. High-fiber diet Continue rosuvastatin , losartan Maintain physical activity  Pulmonary hypertension, tricuspid regurgitation-activity stable at baseline.  Continues to be very physically active.  Noted on echocardiogram 10/23.  Euvolemic. Continue losartan Increase physical activity as tolerated Daily weights-contact office with a weight increase of 2 to 3 pounds overnight or 5 pounds in 1 week Order repeat  echocardiogram  Essential hypertension-BP today 110/70 Continue losartan, diltiazem  Maintain blood pressure log Order BMP  Hyperlipidemia-LDL 90 on 09/14/22 Continue rosuvastatin  High-fiber diet Increase physical activity as tolerated Follows with pcp- please send results to cardiology when collected   Disposition: Follow-up with Dr. Alda Amas or me in 12 months.   Chet Cota. Ferguson Gertner NP-C     05/27/2024, 11:44 AM [facility] [address] Office [phone] Fax [phone]    I spent 14 minutes examining this patient, reviewing medications, and using patient centered shared decision making involving her cardiac care.  Prior to her visit I spent greater than 20 minutes reviewing her past medical history,  medications, and prior cardiac tests.

## 2024-05-27 ENCOUNTER — Encounter: Payer: Self-pay | Admitting: General Practice

## 2024-05-27 ENCOUNTER — Ambulatory Visit: Attending: General Practice | Admitting: General Practice

## 2024-05-27 VITALS — BP 110/70 | HR 49 | Ht 72.0 in | Wt 137.0 lb

## 2024-05-27 DIAGNOSIS — I10 Essential (primary) hypertension: Secondary | ICD-10-CM

## 2024-05-27 DIAGNOSIS — I2510 Atherosclerotic heart disease of native coronary artery without angina pectoris: Secondary | ICD-10-CM

## 2024-05-27 DIAGNOSIS — I272 Pulmonary hypertension, unspecified: Secondary | ICD-10-CM

## 2024-05-27 DIAGNOSIS — I480 Paroxysmal atrial fibrillation: Secondary | ICD-10-CM | POA: Diagnosis not present

## 2024-05-27 DIAGNOSIS — E785 Hyperlipidemia, unspecified: Secondary | ICD-10-CM

## 2024-05-27 MED ORDER — ROSUVASTATIN CALCIUM 5 MG PO TABS: 5.0000 mg | ORAL_TABLET | Freq: Every day | ORAL | 3 refills | Status: AC

## 2024-05-27 NOTE — Patient Instructions (Signed)
 Medication Instructions:  Refill for Rosuvastatin  (Crestor ) sent in.  *If you need a refill on your cardiac medications before your next appointment, please call your pharmacy*  Lab Work: Please have lab work faxed to our office at [phone] once completed.  If you have labs (blood work) drawn today and your tests are completely normal, you will receive your results only by: MyChart Message (if you have MyChart) OR A paper copy in the mail If you have any lab test that is abnormal or we need to change your treatment, we will call you to review the results.  Testing/Procedures: None ordered today.  Follow-Up: At [facility], you and your health needs are our priority.  As part of our continuing mission to provide you with exceptional heart care, our providers are all part of one team.  This team includes your primary Cardiologist (physician) and Advanced Practice Providers or APPs (Physician Assistants and Nurse Practitioners) who all work together to provide you with the care you need, when you need it.  Your next appointment:   1 year(s)  Provider:   Wendie Hamburg, MD

## 2024-08-18 ENCOUNTER — Other Ambulatory Visit: Payer: Self-pay | Admitting: Cardiology

## 2024-08-18 DIAGNOSIS — I480 Paroxysmal atrial fibrillation: Secondary | ICD-10-CM

## 2024-08-18 NOTE — Telephone Encounter (Signed)
 Prescription refill request for Eliquis  received. Indication: a fib Last office visit: 05/27/24 Scr: 0.95 [facility] 05/19/24 Age: 73 Weight: 62kg
